# Patient Record
Sex: Male | Born: 1985 | Race: White | Hispanic: No | Marital: Married | Smoking: Current every day smoker
Health system: Southern US, Community
[De-identification: ages and names within clinical notes are randomized; demographics above are authoritative.]

## PROBLEM LIST (undated history)

## (undated) DIAGNOSIS — F419 Anxiety disorder, unspecified: Secondary | ICD-10-CM

## (undated) DIAGNOSIS — J439 Emphysema, unspecified: Secondary | ICD-10-CM

## (undated) DIAGNOSIS — F909 Attention-deficit hyperactivity disorder, unspecified type: Secondary | ICD-10-CM

## (undated) HISTORY — DX: Anxiety disorder, unspecified: F41.9

## (undated) HISTORY — DX: Attention-deficit hyperactivity disorder, unspecified type: F90.9

## (undated) HISTORY — PX: KNEE SURGERY: SHX244

---
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
## 2006-10-01 ENCOUNTER — Emergency Department: Payer: Self-pay | Admitting: Emergency Medicine

## 2006-12-12 ENCOUNTER — Emergency Department: Payer: Self-pay | Admitting: Emergency Medicine

## 2006-12-20 ENCOUNTER — Emergency Department: Payer: Self-pay | Admitting: Emergency Medicine

## 2007-10-20 ENCOUNTER — Emergency Department: Payer: Self-pay | Admitting: Emergency Medicine

## 2007-10-20 ENCOUNTER — Other Ambulatory Visit: Payer: Self-pay

## 2007-12-07 ENCOUNTER — Emergency Department: Payer: Self-pay | Admitting: Emergency Medicine

## 2008-01-20 ENCOUNTER — Ambulatory Visit: Payer: Self-pay | Admitting: Urology

## 2008-03-01 ENCOUNTER — Emergency Department: Payer: Self-pay | Admitting: Emergency Medicine

## 2008-03-07 ENCOUNTER — Emergency Department: Payer: Self-pay | Admitting: Emergency Medicine

## 2008-08-20 ENCOUNTER — Emergency Department: Payer: Self-pay | Admitting: Unknown Physician Specialty

## 2008-10-25 ENCOUNTER — Emergency Department: Payer: Self-pay | Admitting: Emergency Medicine

## 2010-03-18 ENCOUNTER — Emergency Department: Payer: Self-pay | Admitting: Emergency Medicine

## 2010-10-15 ENCOUNTER — Emergency Department: Payer: Self-pay | Admitting: Emergency Medicine

## 2011-02-02 ENCOUNTER — Ambulatory Visit: Payer: Self-pay

## 2011-02-04 ENCOUNTER — Emergency Department: Payer: Self-pay | Admitting: Emergency Medicine

## 2012-09-08 ENCOUNTER — Emergency Department: Payer: Self-pay | Admitting: Emergency Medicine

## 2017-10-25 ENCOUNTER — Encounter: Payer: Self-pay | Admitting: Emergency Medicine

## 2017-10-25 ENCOUNTER — Other Ambulatory Visit: Payer: Self-pay

## 2017-10-25 ENCOUNTER — Emergency Department
Admission: EM | Admit: 2017-10-25 | Discharge: 2017-10-25 | Disposition: A | Discharge status: Home / Self Care | Payer: Self-pay | Attending: Emergency Medicine | Admitting: Emergency Medicine

## 2017-10-25 ENCOUNTER — Emergency Department: Payer: Self-pay

## 2017-10-25 DIAGNOSIS — R251 Tremor, unspecified: Secondary | ICD-10-CM

## 2017-10-25 DIAGNOSIS — F17200 Nicotine dependence, unspecified, uncomplicated: Secondary | ICD-10-CM | POA: Insufficient documentation

## 2017-10-25 NOTE — ED Triage Notes (Addendum)
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

## 2017-10-27 NOTE — ED Provider Notes (Signed)
[facility] Emergency Department Provider Note    First MD Initiated Contact with Patient 10/25/17 0254     (approximate)  I have reviewed the triage vital signs and the nursing notes.   HISTORY  Chief Complaint Headache and Tremors   HPI Riley Hines is a 32 y.o. male presents to the emergency department with history of tremors.  Patient states that he has had intermittent tremors for the past year.  Patient states that multiple family members have "the shakes".  Patient denies any known family history of brain aneurysm or strokes.  Patient states that he had a headache today where he heard a "pop" area patient states that headache resolved following taking BC powder.  Patient denies any weakness numbness gait instability or visual changes.  Patient states that his tremors have resolved at this time.  Patient states the tremors occur both at rest and with activity.  Past medical history None  There are no active problems to display for this patient.   Past Surgical History:  Procedure Laterality Date  . KNEE SURGERY Left     Prior to Admission medications   Not on File    Allergies No known drug allergies No family history on file.  Social History Social History   Tobacco Use  . Smoking status: Current Every Day Smoker  . Smokeless tobacco: Never Used  Substance Use Topics  . Alcohol use: Never    Frequency: Never  . Drug use: Never    Review of Systems Constitutional: No fever/chills Eyes: No visual changes. ENT: No sore throat. Cardiovascular: Denies chest pain. Respiratory: Denies shortness of breath. Gastrointestinal: No abdominal pain.  No nausea, no vomiting.  No diarrhea.  No constipation. Genitourinary: Negative for dysuria. Musculoskeletal: Negative for neck pain.  Negative for back pain. Integumentary: Negative for rash. Neurological: Negative for headaches, focal weakness or numbness.  Positive for tremors (now  resolved)  ____________________________________________   PHYSICAL EXAM:  VITAL SIGNS: ED Triage Vitals  Enc Vitals Group     BP 10/25/17 0133 114/78     Pulse Rate 10/25/17 0133 65     Resp 10/25/17 0133 18     Temp 10/25/17 0133 (!) 97.5 F (36.4 C)     Temp Source 10/25/17 0133 Oral     SpO2 10/25/17 0133 98 %     Weight 10/25/17 0133 68 kg (150 lb)     Height 10/25/17 0133 1.651 m (5\' 5" )     Head Circumference --      Peak Flow --      Pain Score 10/25/17 0132 4     Pain Loc --      Pain Edu? --      Excl. in GC? --     Constitutional: Alert and oriented. Well appearing and in no acute distress. Eyes: Conjunctivae are normal. PERRL. EOMI. Head: Atraumatic. Mouth/Throat: Mucous membranes are moist.  Neck: No stridor.  No meningeal signs.  Cardiovascular: Normal rate, regular rhythm. Good peripheral circulation. Grossly normal heart sounds. Respiratory: Normal respiratory effort.  No retractions. Lungs CTAB. Gastrointestinal: Soft and nontender. No distention.  Musculoskeletal: No lower extremity tenderness nor edema. No gross deformities of extremities. Neurologic:  Normal speech and language. No gross focal neurologic deficits are appreciated.  No tremors appreciated Skin:  Skin is warm, dry and intact. No rash noted. Psychiatric: Mood and affect are normal. Speech and behavior are normal.    RADIOLOGY I, [place] Aqeel Norgaard, personally viewed and evaluated  these images (plain radiographs) as part of my medical decision making, as well as reviewing the written report by the radiologist.**}  ED MD interpretation: Normal noncontrast CT scan of the brain per radiologist.  Official radiology report(s): No results found.    Procedures   ____________________________________________   INITIAL IMPRESSION / ASSESSMENT AND PLAN / ED COURSE  As part of my medical decision making, I reviewed the following data within the electronic MEDICAL RECORD NUMBER   32 year old  male presenting to the emergency department with a history of chronic tremors.  Consider the possibility of intracranial pathology including mass CVA however suspected to be unlikely.  CT scan of the head revealed no acute abnormality.  Also consider possibly of essential tremor and as such patient will be referred to neurology for further outpatient evaluation. ____________________________________________  FINAL CLINICAL IMPRESSION(S) / ED DIAGNOSES  Final diagnoses:  Occasional tremors     MEDICATIONS GIVEN DURING THIS VISIT:  Medications - No data to display   ED Discharge Orders    None       Note:  This document was prepared using Dragon voice recognition software and may include unintentional dictation errors.    Darci Current, MD 10/27/17 2253

## 2018-05-01 ENCOUNTER — Other Ambulatory Visit: Payer: Self-pay | Admitting: Family Medicine

## 2018-05-06 ENCOUNTER — Other Ambulatory Visit: Payer: Self-pay | Admitting: Family Medicine

## 2018-05-13 ENCOUNTER — Emergency Department: Payer: No Typology Code available for payment source

## 2018-05-13 ENCOUNTER — Other Ambulatory Visit: Payer: Self-pay

## 2018-05-13 ENCOUNTER — Emergency Department
Admission: EM | Admit: 2018-05-13 | Discharge: 2018-05-13 | Disposition: A | Discharge status: Home / Self Care | Payer: No Typology Code available for payment source | Attending: Emergency Medicine | Admitting: Emergency Medicine

## 2018-05-13 DIAGNOSIS — Y9289 Other specified places as the place of occurrence of the external cause: Secondary | ICD-10-CM | POA: Insufficient documentation

## 2018-05-13 DIAGNOSIS — W270XXA Contact with workbench tool, initial encounter: Secondary | ICD-10-CM | POA: Insufficient documentation

## 2018-05-13 DIAGNOSIS — S6982XA Other specified injuries of left wrist, hand and finger(s), initial encounter: Secondary | ICD-10-CM | POA: Diagnosis present

## 2018-05-13 DIAGNOSIS — Y939 Activity, unspecified: Secondary | ICD-10-CM | POA: Insufficient documentation

## 2018-05-13 DIAGNOSIS — F17200 Nicotine dependence, unspecified, uncomplicated: Secondary | ICD-10-CM | POA: Diagnosis not present

## 2018-05-13 DIAGNOSIS — Y998 Other external cause status: Secondary | ICD-10-CM | POA: Insufficient documentation

## 2018-05-13 DIAGNOSIS — S60222A Contusion of left hand, initial encounter: Secondary | ICD-10-CM

## 2018-05-13 NOTE — ED Notes (Signed)
Pt employed with [employer], no workers comp profile listed in our system

## 2018-05-13 NOTE — Discharge Instructions (Signed)
Apply ice to reduce swelling. Take OTC Tylenol and Motrin for pain and inflammation/ Follow-up with [facility] as needed.

## 2018-05-13 NOTE — ED Triage Notes (Signed)
Pt was at work and sledgehammer fell onto left hand. Swelling noted to area.

## 2018-05-13 NOTE — ED Provider Notes (Signed)
[facility] Emergency Department Provider Note ____________________________________________  Time seen: 2320  I have reviewed the triage vital signs and the nursing notes.  HISTORY  Chief Complaint  Hand Pain  HPI Riley Hines is a 33 y.o. male since himself to the ED for evaluation of left hand pain.  Patient describes at work, he accidentally hit his left hand with a sledgehammer. He was trying to hit a metal punch he was holding.  He completed his workday and went home. His wife encouraged him to report to the ED due to swelling to the dorsal hand. He presents now with swelling noted to the left hand.  He denies any other injury at this time.  No past medical history on file.  There are no active problems to display for this patient.  Past Surgical History:  Procedure Laterality Date  . KNEE SURGERY Left     Prior to Admission medications   Not on File    Allergies Patient has no known allergies.  No family history on file.  Social History Social History   Tobacco Use  . Smoking status: Current Every Day Smoker  . Smokeless tobacco: Never Used  Substance Use Topics  . Alcohol use: Never    Frequency: Never  . Drug use: Never    Review of Systems  Constitutional: Negative for fever. Cardiovascular: Negative for chest pain. Respiratory: Negative for shortness of breath. Musculoskeletal: Negative for back pain.  Left hand pain as above. Skin: Negative for rash. Neurological: Negative for headaches, focal weakness or numbness. ____________________________________________  PHYSICAL EXAM:  VITAL SIGNS: ED Triage Vitals [05/13/18 2248]  Enc Vitals Group     BP (!) 131/92     Pulse Rate (!) 55     Resp 18     Temp 97.8 F (36.6 C)     Temp Source Oral     SpO2 99 %     Weight 150 lb (68 kg)     Height 5\' 5"  (1.651 m)     Head Circumference      Peak Flow      Pain Score 3     Pain Loc      Pain Edu?      Excl. in GC?      Constitutional: Alert and oriented. Well appearing and in no distress. Head: Normocephalic and atraumatic. Eyes: Conjunctivae are normal. Normal extraocular movements Cardiovascular: Normal rate, regular rhythm. Normal distal pulses. Respiratory: Normal respiratory effort. No wheezes/rales/rhonchi. Gastrointestinal: Soft and nontender. No distention. Musculoskeletal: Left hand with a moderate soft tissue swelling to the dorsolateral hand. Normal composite fist. Nontender with normal range of motion in all extremities.  Neurologic:  Normal gross sensation. Normal intrinsic and opposition testing. Normal speech and language. No gross focal neurologic deficits are appreciated. Skin:  Skin is warm, dry and intact. No rash noted. ____________________________________________   RADIOLOGY  Left Hand  Negative ____________________________________________  PROCEDURES  Procedures ____________________________________________  INITIAL IMPRESSION / ASSESSMENT AND PLAN / ED COURSE  Patient with ED evaluation of right hand contusion. He is reassured by his negative hand XR. He will follow-up with [facility] as needed. He will apply ice for swelling. He is returned to work without restrictions.  ____________________________________________  FINAL CLINICAL IMPRESSION(S) / ED DIAGNOSES  Final diagnoses:  Contusion of left hand, initial encounter      Lissa Hoard, PA-C 05/14/18 0011    Phineas Semen, MD 05/16/18 1155

## 2018-05-15 ENCOUNTER — Other Ambulatory Visit: Payer: Self-pay | Admitting: Family Medicine

## 2018-05-15 DIAGNOSIS — R221 Localized swelling, mass and lump, neck: Secondary | ICD-10-CM

## 2018-05-15 DIAGNOSIS — R9389 Abnormal findings on diagnostic imaging of other specified body structures: Secondary | ICD-10-CM

## 2018-05-16 ENCOUNTER — Ambulatory Visit: Admission: EM | Admit: 2018-05-16 | Discharge: 2018-05-16 | Discharge status: Against Medical Advice | Payer: Self-pay

## 2018-05-22 ENCOUNTER — Ambulatory Visit
Admission: RE | Admit: 2018-05-22 | Discharge: 2018-05-22 | Disposition: A | Discharge status: Home / Self Care | Payer: Commercial Managed Care - PPO | Source: Ambulatory Visit | Attending: Family Medicine | Admitting: Family Medicine

## 2018-05-22 DIAGNOSIS — R9389 Abnormal findings on diagnostic imaging of other specified body structures: Secondary | ICD-10-CM | POA: Diagnosis not present

## 2018-05-22 DIAGNOSIS — R221 Localized swelling, mass and lump, neck: Secondary | ICD-10-CM | POA: Diagnosis not present

## 2018-05-22 MED ORDER — IOPAMIDOL (ISOVUE-300) INJECTION 61%
75.0000 mL | Freq: Once | INTRAVENOUS | Status: AC | PRN
  Administered 2018-05-22: 75 mL via INTRAVENOUS

## 2018-06-12 DIAGNOSIS — F411 Generalized anxiety disorder: Secondary | ICD-10-CM | POA: Insufficient documentation

## 2018-06-13 ENCOUNTER — Emergency Department
Admission: EM | Admit: 2018-06-13 | Discharge: 2018-06-13 | Disposition: A | Discharge status: Home / Self Care | Payer: Commercial Managed Care - PPO | Attending: Emergency Medicine | Admitting: Emergency Medicine

## 2018-06-13 ENCOUNTER — Other Ambulatory Visit: Payer: Self-pay

## 2018-06-13 ENCOUNTER — Encounter: Payer: Self-pay | Admitting: Emergency Medicine

## 2018-06-13 ENCOUNTER — Emergency Department: Payer: Commercial Managed Care - PPO

## 2018-06-13 DIAGNOSIS — J101 Influenza due to other identified influenza virus with other respiratory manifestations: Secondary | ICD-10-CM | POA: Insufficient documentation

## 2018-06-13 DIAGNOSIS — J189 Pneumonia, unspecified organism: Secondary | ICD-10-CM | POA: Diagnosis not present

## 2018-06-13 DIAGNOSIS — J181 Lobar pneumonia, unspecified organism: Secondary | ICD-10-CM | POA: Diagnosis not present

## 2018-06-13 DIAGNOSIS — R05 Cough: Secondary | ICD-10-CM | POA: Diagnosis present

## 2018-06-13 HISTORY — DX: Emphysema, unspecified: J43.9

## 2018-06-13 LAB — INFLUENZA PANEL BY PCR (TYPE A & B)
Influenza A By PCR: POSITIVE — AB
Influenza B By PCR: NEGATIVE

## 2018-06-13 MED ORDER — ACETAMINOPHEN 325 MG PO TABS
ORAL_TABLET | ORAL | Status: AC
  Administered 2018-06-13: 650 mg via ORAL
  Filled 2018-06-13: qty 2

## 2018-06-13 MED ORDER — IBUPROFEN 600 MG PO TABS: 600.0000 mg | ORAL_TABLET | Freq: Three times a day (TID) | ORAL | 0 refills | Status: DC | PRN

## 2018-06-13 MED ORDER — PROMETHAZINE-DM 6.25-15 MG/5ML PO SYRP: 5.0000 mL | ORAL_SOLUTION | Freq: Four times a day (QID) | ORAL | 0 refills | Status: DC | PRN

## 2018-06-13 MED ORDER — OSELTAMIVIR PHOSPHATE 75 MG PO CAPS: 75.0000 mg | ORAL_CAPSULE | Freq: Two times a day (BID) | ORAL | 0 refills | Status: AC

## 2018-06-13 MED ORDER — ACETAMINOPHEN 325 MG PO TABS
650.0000 mg | ORAL_TABLET | Freq: Once | ORAL | Status: AC | PRN
  Administered 2018-06-13: 650 mg via ORAL

## 2018-06-13 MED ORDER — HYDROCOD POLST-CPM POLST ER 10-8 MG/5ML PO SUER
5.0000 mL | Freq: Once | ORAL | Status: AC
  Administered 2018-06-13: 5 mL via ORAL
  Filled 2018-06-13: qty 5

## 2018-06-13 MED ORDER — AZITHROMYCIN 250 MG PO TABS: ORAL_TABLET | ORAL | 0 refills | Status: AC

## 2018-06-13 NOTE — ED Provider Notes (Signed)
[facility] Emergency Department Provider Note   ____________________________________________   First MD Initiated Contact with Patient 06/13/18 1335     (approximate)  I have reviewed the triage vital signs and the nursing notes.   HISTORY  Chief Complaint Fever and Cough    HPI Riley Hines is a 33 y.o. male patient presents with cute onset of cough and fever which began last night.  Patient waking this morning with increased cough and body aches.  Patient unable to smoke secondary to coughing.  Patient denies nausea, vomiting, diarrhea.  Patient states he never takes flu shots.  Patient rates his pain as a 5/10.  Patient described the pain is "achy".  No palliative measure for complaint.  Patient presents with fever in triage and was given Tylenol.         Past Medical History:  Diagnosis Date  . Lung blebs (HCC)     There are no active problems to display for this patient.   Past Surgical History:  Procedure Laterality Date  . KNEE SURGERY Left     Prior to Admission medications   Medication Sig Start Date End Date Taking? Authorizing Provider  azithromycin (ZITHROMAX Z-PAK) 250 MG tablet Take 2 tablets (500 mg) on  Day 1,  followed by 1 tablet (250 mg) once daily on Days 2 through 5. 06/13/18 06/18/18  Joni Reining, PA-C  ibuprofen (ADVIL,MOTRIN) 600 MG tablet Take 1 tablet (600 mg total) by mouth every 8 (eight) hours as needed. 06/13/18   Joni Reining, PA-C  oseltamivir (TAMIFLU) 75 MG capsule Take 1 capsule (75 mg total) by mouth 2 (two) times daily for 5 days. 06/13/18 06/18/18  Joni Reining, PA-C  promethazine-dextromethorphan (PROMETHAZINE-DM) 6.25-15 MG/5ML syrup Take 5 mLs by mouth 4 (four) times daily as needed for cough. 06/13/18   Joni Reining, PA-C    Allergies Hydrocodone; Oxycodone; and Oxycodone-acetaminophen  History reviewed. No pertinent family history.  Social History Social History   Tobacco Use  . Smoking  status: Current Every Day Smoker  . Smokeless tobacco: Never Used  Substance Use Topics  . Alcohol use: Never    Frequency: Never  . Drug use: Never    Review of Systems Constitutional: Fever and body aches. Eyes: No visual changes. ENT: No sore throat.  Nasal congestion. Cardiovascular: Denies chest pain. Respiratory: Denies shortness of breath.  Productive cough. Gastrointestinal: No abdominal pain.  No nausea, no vomiting.  No diarrhea.  No constipation. Genitourinary: Negative for dysuria. Musculoskeletal: Negative for back pain. Skin: Negative for rash. Neurological: Negative for headaches, focal weakness or numbness. Allergic/Immunilogical: Hydrocodone.  ____________________________________________   PHYSICAL EXAM:  VITAL SIGNS: ED Triage Vitals [06/13/18 1324]  Enc Vitals Group     BP      Pulse Rate (!) 102     Resp (!) 24     Temp (!) 102 F (38.9 C)     Temp Source Oral     SpO2 97 %     Weight 150 lb (68 kg)     Height 5\' 5"  (1.651 m)     Head Circumference      Peak Flow      Pain Score 5     Pain Loc      Pain Edu?      Excl. in GC?     Constitutional: Alert and oriented.  Moderate distress.  Febrile.   Eyes: Conjunctivae are normal. PERRL. EOMI. Head: Atraumatic. Nose: Edematous nasal  turbinates clear rhinorrhea.  Mouth/Throat: Mucous membranes are moist.  Oropharynx non-erythematous.  Postnasal drainage. Neck: No stridor.  Hematological/Lymphatic/Immunilogical: No cervical lymphadenopathy. Cardiovascular: Normal rate, regular rhythm. Grossly normal heart sounds.  Good peripheral circulation. Respiratory: Normal respiratory effort.  No retractions. Lungs CTAB. Gastrointestinal: Soft and nontender. No distention. No abdominal bruits. No CVA tenderness. Musculoskeletal: No lower extremity tenderness nor edema.  No joint effusions. Neurologic:  Normal speech and language. No gross focal neurologic deficits are appreciated. No gait instability. Skin:   Skin is warm, dry and intact. No rash noted. Psychiatric: Mood and affect are normal. Speech and behavior are normal.  ____________________________________________   LABS (all labs ordered are listed, but only abnormal results are displayed)  Labs Reviewed  INFLUENZA PANEL BY PCR (TYPE A & B) - Abnormal; Notable for the following components:      Result Value   Influenza A By PCR POSITIVE (*)    All other components within normal limits   ____________________________________________  EKG   ____________________________________________  RADIOLOGY  ED MD interpretation:    Official radiology report(s): Dg Chest 2 View  Result Date: 06/13/2018 CLINICAL DATA:  Shortness of breath, fever and productive cough. EXAM: CHEST - 2 VIEW COMPARISON:  08/20/2008 FINDINGS: Cardiomediastinal silhouette is normal. Mediastinal contours appear intact. There is no evidence of pleural effusion or pneumothorax. Subtle peribronchial airspace consolidation versus pulmonary nodules in the right lung base. Upper lobe predominant emphysema/subpleural blebs noted. Osseous structures are without acute abnormality. Soft tissues are grossly normal. IMPRESSION: 1. Subtle peribronchial airspace consolidation versus pulmonary nodules in the right lung base. If there is clinical suspicion for pneumonia, follow up to resolution after empiric treatment is recommended. 2. Upper lobe predominant emphysema/subpleural blebs. Electronically Signed   By: Ted Mcalpine M.D.   On: 06/13/2018 14:04    ____________________________________________   PROCEDURES  Procedure(s) performed (including Critical Care):  Procedures   ____________________________________________   INITIAL IMPRESSION / ASSESSMENT AND PLAN / ED COURSE  As part of my medical decision making, I reviewed the following data within the electronic MEDICAL RECORD NUMBER     Patient presents with  onset of fever and cough which began last night.    Patient x-ray is consistent with findings of pneumonia.  Patient also test positive for influenza A.  Patient given discharge care instruction work note.  Patient advised take medication as directed and [facility]          ____________________________________________   FINAL CLINICAL IMPRESSION(S) / ED DIAGNOSES  Final diagnoses:  Influenza A  Community acquired pneumonia of right lower lobe of lung [facility])     ED Discharge Orders         Ordered    oseltamivir (TAMIFLU) 75 MG capsule  2 times daily     06/13/18 1455    azithromycin (ZITHROMAX Z-PAK) 250 MG tablet     06/13/18 1455    promethazine-dextromethorphan (PROMETHAZINE-DM) 6.25-15 MG/5ML syrup  4 times daily PRN     06/13/18 1455    ibuprofen (ADVIL,MOTRIN) 600 MG tablet  Every 8 hours PRN     06/13/18 1455           Note:  This document was prepared using Dragon voice recognition software and may include unintentional dictation errors.    Joni Reining, PA-C 06/13/18 1458    Sharman Cheek, MD 06/17/18 [phone]

## 2018-06-13 NOTE — ED Triage Notes (Signed)
Pt presents to ED via POV with c/o cough and fever that started last night. Pt c/o productive cough, states this morning while attempting to smoke a cigarette he began to gag. Pt also c/o generalized body aches, states symptoms started last night. Pt with fever and productive cough upon arrival to ED. Mask given prior to patient arrival in triage.

## 2018-07-19 ENCOUNTER — Other Ambulatory Visit: Payer: Self-pay

## 2018-07-19 ENCOUNTER — Ambulatory Visit: Payer: Commercial Managed Care - PPO | Admitting: Licensed Clinical Social Worker

## 2018-07-31 ENCOUNTER — Ambulatory Visit (INDEPENDENT_AMBULATORY_CARE_PROVIDER_SITE_OTHER): Payer: Commercial Managed Care - PPO | Admitting: Psychiatry

## 2018-07-31 ENCOUNTER — Other Ambulatory Visit: Payer: Self-pay

## 2018-07-31 DIAGNOSIS — Z5329 Procedure and treatment not carried out because of patient's decision for other reasons: Secondary | ICD-10-CM

## 2018-07-31 NOTE — Progress Notes (Deleted)
Psychiatric Initial Adult Assessment   Patient Identification: Riley Hines MRN:  [id number] Date of Evaluation:  07/31/2018 Referral Source: *** Chief Complaint:   Visit Diagnosis: No diagnosis found.  History of Present Illness:  ***  Associated Signs/Symptoms: Depression Symptoms:  {DEPRESSION SYMPTOMS:20000} (Hypo) Manic Symptoms:  {BHH MANIC SYMPTOMS:22872} Anxiety Symptoms:  {BHH ANXIETY SYMPTOMS:22873} Psychotic Symptoms:  {BHH PSYCHOTIC SYMPTOMS:22874} PTSD Symptoms: {BHH PTSD SYMPTOMS:22875}  Past Psychiatric History: ***  Previous Psychotropic Medications: {YES/NO:21197}  Substance Abuse History in the last 12 months:  {yes no:314532}  Consequences of Substance Abuse: {BHH CONSEQUENCES OF SUBSTANCE ABUSE:22880}  Past Medical History:  Past Medical History:  Diagnosis Date  . Lung blebs [facility])     Past Surgical History:  Procedure Laterality Date  . KNEE SURGERY Left     Family Psychiatric History: ***  Family History: No family history on file.  Social History:   Social History   Socioeconomic History  . Marital status: Married    Spouse name: Not on file  . Number of children: Not on file  . Years of education: Not on file  . Highest education level: Not on file  Occupational History  . Not on file  Social Needs  . Financial resource strain: Not on file  . Food insecurity:    Worry: Not on file    Inability: Not on file  . Transportation needs:    Medical: Not on file    Non-medical: Not on file  Tobacco Use  . Smoking status: Current Every Day Smoker  . Smokeless tobacco: Never Used  Substance and Sexual Activity  . Alcohol use: Never    Frequency: Never  . Drug use: Never  . Sexual activity: Not on file  Lifestyle  . Physical activity:    Days per week: Not on file    Minutes per session: Not on file  . Stress: Not on file  Relationships  . Social connections:    Talks on phone: Not on file    Gets together: Not on file   Attends religious service: Not on file    Active member of club or organization: Not on file    Attends meetings of clubs or organizations: Not on file    Relationship status: Not on file  Other Topics Concern  . Not on file  Social History Narrative  . Not on file    Additional Social History: ***  Allergies:   Allergies  Allergen Reactions  . Hydrocodone Other (See Comments)    Violent, suicidal thoughts.    . Oxycodone Other (See Comments)    Pt rerpots feeling bugs under the skin, cannot sleep, hallucinations   . Oxycodone-Acetaminophen Other (See Comments)    Violent, suicidal thoughts.     Metabolic Disorder Labs: No results found for: HGBA1C, MPG No results found for: PROLACTIN No results found for: CHOL, TRIG, HDL, CHOLHDL, VLDL, LDLCALC No results found for: TSH  Therapeutic Level Labs: No results found for: LITHIUM No results found for: CBMZ No results found for: VALPROATE  Current Medications: Current Outpatient Medications  Medication Sig Dispense Refill  . ibuprofen (ADVIL,MOTRIN) 600 MG tablet Take 1 tablet (600 mg total) by mouth every 8 (eight) hours as needed. 15 tablet 0  . promethazine-dextromethorphan (PROMETHAZINE-DM) 6.25-15 MG/5ML syrup Take 5 mLs by mouth 4 (four) times daily as needed for cough. 118 mL 0   No current facility-administered medications for this visit.     Musculoskeletal: Strength & Muscle Tone: {desc; muscle tone:32375} Gait &  Station: {PE GAIT ED [id number]:22525} Patient leans: {Patient Leans:21022755}  Psychiatric Specialty Exam: ROS  There were no vitals taken for this visit.There is no height or weight on file to calculate BMI.  General Appearance: {Appearance:22683}  Eye Contact:  {BHH EYE CONTACT:22684}  Speech:  {Speech:22685}  Volume:  {Volume (PAA):22686}  Mood:  {BHH MOOD:22306}  Affect:  {Affect (PAA):22687}  Thought Process:  {Thought Process (PAA):22688}  Orientation:  {BHH ORIENTATION (PAA):22689}  Thought  Content:  {Thought Content:22690}  Suicidal Thoughts:  {ST/HT (PAA):22692}  Homicidal Thoughts:  {ST/HT (PAA):22692}  Memory:  {BHH MEMORY:22881}  Judgement:  {Judgement (PAA):22694}  Insight:  {Insight (PAA):22695}  Psychomotor Activity:  {Psychomotor (PAA):22696}  Concentration:  {Concentration:21399}  Recall:  {BHH GOOD/FAIR/POOR:22877}  Fund of Knowledge:{BHH GOOD/FAIR/POOR:22877}  Language: {BHH GOOD/FAIR/POOR:22877}  Akathisia:  {BHH YES OR NO:22294}  Handed:  {Handed:22697}  AIMS (if indicated):  {Desc; done/not:10129}  Assets:  {Assets (PAA):22698}  ADL's:  {BHH ZOX'W:96045}ADL'S:22290}  Cognition: {chl bhh cognition:304700322}  Sleep:  {BHH GOOD/FAIR/POOR:22877}   Screenings:   Assessment and Plan: ***   [name], MD 4/29/20209:57 AM

## 2018-08-08 ENCOUNTER — Encounter: Payer: Self-pay | Admitting: Psychiatry

## 2018-08-08 ENCOUNTER — Other Ambulatory Visit: Payer: Self-pay

## 2018-08-08 ENCOUNTER — Ambulatory Visit (INDEPENDENT_AMBULATORY_CARE_PROVIDER_SITE_OTHER): Payer: Commercial Managed Care - PPO | Admitting: Psychiatry

## 2018-08-08 DIAGNOSIS — F17200 Nicotine dependence, unspecified, uncomplicated: Secondary | ICD-10-CM

## 2018-08-08 DIAGNOSIS — F411 Generalized anxiety disorder: Secondary | ICD-10-CM | POA: Diagnosis not present

## 2018-08-08 DIAGNOSIS — F3181 Bipolar II disorder: Secondary | ICD-10-CM

## 2018-08-08 MED ORDER — QUETIAPINE FUMARATE 25 MG PO TABS: 25.0000 mg | ORAL_TABLET | Freq: Every day | ORAL | 0 refills | Status: DC
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
## 2018-08-08 NOTE — Progress Notes (Signed)
Tc on  08-08-18 @ 1:05 pt medical and surgical hx was reviewed and updated. Pt medications and pharmacy was reviewed and updated. Allergies were reviewed with no changes. No vitals taken due to this was a phone review.

## 2018-08-20 ENCOUNTER — Encounter: Payer: Self-pay | Admitting: Psychiatry

## 2018-08-20 ENCOUNTER — Other Ambulatory Visit: Payer: Self-pay

## 2018-08-20 ENCOUNTER — Ambulatory Visit (INDEPENDENT_AMBULATORY_CARE_PROVIDER_SITE_OTHER): Payer: Commercial Managed Care - PPO | Admitting: Psychiatry

## 2018-08-20 DIAGNOSIS — F17200 Nicotine dependence, unspecified, uncomplicated: Secondary | ICD-10-CM

## 2018-08-20 DIAGNOSIS — F3181 Bipolar II disorder: Secondary | ICD-10-CM | POA: Diagnosis not present

## 2018-08-20 DIAGNOSIS — F411 Generalized anxiety disorder: Secondary | ICD-10-CM | POA: Diagnosis not present

## 2018-08-20 MED ORDER — ARIPIPRAZOLE 2 MG PO TABS: 2.0000 mg | ORAL_TABLET | Freq: Every day | ORAL | 1 refills | Status: DC

## 2018-08-20 MED ORDER — TRAZODONE HCL 50 MG PO TABS: 25.0000 mg | ORAL_TABLET | Freq: Every evening | ORAL | 1 refills | Status: DC | PRN
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
## 2018-08-30 ENCOUNTER — Other Ambulatory Visit: Payer: Self-pay | Admitting: Psychiatry

## 2018-08-30 DIAGNOSIS — F3181 Bipolar II disorder: Secondary | ICD-10-CM

## 2018-09-11 ENCOUNTER — Other Ambulatory Visit: Payer: Self-pay

## 2018-09-11 ENCOUNTER — Encounter: Payer: Self-pay | Admitting: Psychiatry

## 2018-09-11 ENCOUNTER — Ambulatory Visit (INDEPENDENT_AMBULATORY_CARE_PROVIDER_SITE_OTHER): Payer: Commercial Managed Care - PPO | Admitting: Psychiatry

## 2018-09-11 DIAGNOSIS — F411 Generalized anxiety disorder: Secondary | ICD-10-CM

## 2018-09-11 DIAGNOSIS — F3181 Bipolar II disorder: Secondary | ICD-10-CM

## 2018-09-11 DIAGNOSIS — F17200 Nicotine dependence, unspecified, uncomplicated: Secondary | ICD-10-CM

## 2018-09-11 MED ORDER — ESCITALOPRAM OXALATE 10 MG PO TABS: 10.0000 mg | ORAL_TABLET | Freq: Every day | ORAL | 2 refills | Status: DC

## 2018-09-11 NOTE — Progress Notes (Signed)
Virtual Visit via Video Note  I connected with Riley Hines on 09/11/18 at 11:45 AM EDT by a video enabled telemedicine application and verified that I am speaking with the correct person using two identifiers.   I discussed the limitations of evaluation and management by telemedicine and the availability of in person appointments. The patient expressed understanding and agreed to proceed.    I discussed the assessment and treatment plan with the patient. The patient was provided an opportunity to ask questions and all were answered. The patient agreed with the plan and demonstrated an understanding of the instructions.   The patient was advised to call back or seek an in-person evaluation if the symptoms worsen or if the condition fails to improve as anticipated.   [facility] MD OP Progress Note  09/11/2018 1:28 PM Riley Hines  MRN:  [id number]  Chief Complaint:  Chief Complaint    Follow-up     HPI: Riley Hines is a 33 year old Caucasian male, married, employed, lives in [place] has a history of bipolar disorder, GAD, tobacco use disorder was evaluated by telemedicine today.  Patient today reports he is tolerating the Abilify well.  He reports the Abilify has improved his mood lability.  He reports his irritability and anxiety symptoms have improved a lot with the Abilify.  Patient reports he continues to be compliant with Lexapro.  He reports sleep is improved on the combination of medications.  He also has trazodone available.  He continues to smoke cigarettes and has been trying to cut back.  He reports he has been Spending more quality time with his wife.  This also has helped.  Patient reports he never got a call for the therapy appointment.  Discussed with him that the referral can be send again today.  Patient denies any other concerns today. Visit Diagnosis:    ICD-10-CM   1. Bipolar 2 disorder (HCC) F31.81 escitalopram (LEXAPRO) 10 MG tablet   mixed mild  2. GAD (generalized  anxiety disorder) F41.1 escitalopram (LEXAPRO) 10 MG tablet  3. Tobacco use disorder F17.200     Past Psychiatric History: Reviewed past psychiatric history from my progress note on 08/08/2018.  Past trials of Lexapro.  Past Medical History:  Past Medical History:  Diagnosis Date  . ADHD (attention deficit hyperactivity disorder)   . Anxiety   . Lung blebs [facility](HCC)     Past Surgical History:  Procedure Laterality Date  . KNEE SURGERY Left     Family Psychiatric History: Reviewed family psychiatric history from my progress note on 08/08/2018.  Family History:  Family History  Problem Relation Age of Onset  . Anxiety disorder Father   . Depression Father   . ADD / ADHD Brother     Social History: I have reviewed social history from my progress note on 08/08/2018. Social History   Socioeconomic History  . Marital status: Married    Spouse name: amy  . Number of children: 3  . Years of education: Not on file  . Highest education level: High school graduate  Occupational History  . Not on file  Social Needs  . Financial resource strain: Not hard at all  . Food insecurity:    Worry: Never true    Inability: Never true  . Transportation needs:    Medical: No    Non-medical: No  Tobacco Use  . Smoking status: Current Every Day Smoker    Packs/day: 1.00    Types: Cigarettes  . Smokeless tobacco: Never Used  Substance  and Sexual Activity  . Alcohol use: Not Currently    Frequency: Never  . Drug use: Never  . Sexual activity: Yes  Lifestyle  . Physical activity:    Days per week: 0 days    Minutes per session: 0 min  . Stress: Rather much  Relationships  . Social connections:    Talks on phone: Not on file    Gets together: Not on file    Attends religious service: Never    Active member of club or organization: No    Attends meetings of clubs or organizations: Never    Relationship status: Married  Other Topics Concern  . Not on file  Social History Narrative  .  Not on file    Allergies:  Allergies  Allergen Reactions  . Hydrocodone Other (See Comments)    Violent, suicidal thoughts.    . Oxycodone Other (See Comments)    Pt rerpots feeling bugs under the skin, cannot sleep, hallucinations   . Oxycodone-Acetaminophen Other (See Comments)    Violent, suicidal thoughts.     Metabolic Disorder Labs: No results found for: HGBA1C, MPG No results found for: PROLACTIN No results found for: CHOL, TRIG, HDL, CHOLHDL, VLDL, LDLCALC No results found for: TSH  Therapeutic Level Labs: No results found for: LITHIUM No results found for: VALPROATE No components found for:  CBMZ  Current Medications: Current Outpatient Medications  Medication Sig Dispense Refill  . ARIPiprazole (ABILIFY) 2 MG tablet Take 1 tablet (2 mg total) by mouth daily. For mood 30 tablet 1  . escitalopram (LEXAPRO) 10 MG tablet Take 1 tablet (10 mg total) by mouth daily. 30 tablet 2  . traZODone (DESYREL) 50 MG tablet Take 0.5-1 tablets (25-50 mg total) by mouth at bedtime as needed for sleep. 30 tablet 1   No current facility-administered medications for this visit.      Musculoskeletal: Strength & Muscle Tone: within normal limits Gait & Station: normal Patient leans: N/A  Psychiatric Specialty Exam: Review of Systems  Psychiatric/Behavioral: The patient is nervous/anxious.   All other systems reviewed and are negative.   There were no vitals taken for this visit.There is no height or weight on file to calculate BMI.  General Appearance: Casual  Eye Contact:  Fair  Speech:  Clear and Coherent  Volume:  Normal  Mood:  Anxious  Affect:  Congruent  Thought Process:  Goal Directed and Descriptions of Associations: Intact  Orientation:  Full (Time, Place, and Person)  Thought Content: Logical   Suicidal Thoughts:  No  Homicidal Thoughts:  No  Memory:  Immediate;   Fair Recent;   Fair Remote;   Fair  Judgement:  Fair  Insight:  Fair  Psychomotor Activity:   Normal  Concentration:  Concentration: Fair and Attention Span: Fair  Recall:  [employer]  Fund of Knowledge: Fair  Language: Fair  Akathisia:  No  Handed:  Right  AIMS (if indicated): Denies tremors, rigidity, stiffness  Assets:  Communication Skills Desire for Improvement Social Support  ADL's:  Intact  Cognition: WNL  Sleep:  improving   Screenings:   Assessment and Plan: Riley Hines is a 33 year old Caucasian male, employed, married, lives in [place], has a history of anxiety, bipolar disorder, tobacco use disorder was evaluated by telemedicine today.  Patient is biologically predisposed given his family history as well as history of trauma.  He also has psychosocial stressors of current COVID-19 outbreak.  Patient however is making progress on the current medication regimen.  Discussed  plan as noted below.  Plan Bipolar disorder type II-improving Abilify 2 mg p.o. daily. Lexapro as prescribed Trazodone 25 to 50 mg p.o. nightly as needed for insomnia  For GAD-some improvement Lexapro 10 mg p.o. daily. Patient has been referred for CBT-pending  For tobacco use disorder- improving Provided smoking cessation counseling.  For history of trauma- we will refer him to our therapist here in clinic.-Pending.  Follow-up in clinic in 1 month or sooner if needed.  July 7 at 10:15 AM.  I have spent atleast 15 minutes non  face to face with patient today. More than 50 % of the time was spent for psychoeducation and supportive psychotherapy and care coordination.  This note was generated in part or whole with voice recognition software. Voice recognition is usually quite accurate but there are transcription errors that can and very often do occur. I apologize for any typographical errors that were not detected and corrected.       Ursula Alert, MD 09/11/2018, 1:28 PM

## 2018-10-01 ENCOUNTER — Ambulatory Visit: Payer: Commercial Managed Care - PPO | Admitting: Licensed Clinical Social Worker

## 2018-10-01 ENCOUNTER — Other Ambulatory Visit: Payer: Self-pay

## 2018-10-08 ENCOUNTER — Ambulatory Visit (INDEPENDENT_AMBULATORY_CARE_PROVIDER_SITE_OTHER): Payer: Commercial Managed Care - PPO | Admitting: Psychiatry

## 2018-10-08 ENCOUNTER — Other Ambulatory Visit: Payer: Self-pay | Admitting: Psychiatry

## 2018-10-08 ENCOUNTER — Other Ambulatory Visit: Payer: Self-pay

## 2018-10-08 ENCOUNTER — Encounter: Payer: Self-pay | Admitting: Psychiatry

## 2018-10-08 DIAGNOSIS — F3181 Bipolar II disorder: Secondary | ICD-10-CM

## 2018-10-08 DIAGNOSIS — F411 Generalized anxiety disorder: Secondary | ICD-10-CM | POA: Diagnosis not present

## 2018-10-08 DIAGNOSIS — F17200 Nicotine dependence, unspecified, uncomplicated: Secondary | ICD-10-CM | POA: Insufficient documentation

## 2018-10-08 MED ORDER — ARIPIPRAZOLE 10 MG PO TABS: 10.0000 mg | ORAL_TABLET | Freq: Every day | ORAL | 1 refills | Status: DC

## 2018-10-08 MED ORDER — HYDROXYZINE HCL 25 MG PO TABS: 25.0000 mg | ORAL_TABLET | Freq: Two times a day (BID) | ORAL | 1 refills | Status: DC | PRN

## 2018-10-08 MED ORDER — TRAZODONE HCL 100 MG PO TABS: 100.0000 mg | ORAL_TABLET | Freq: Every day | ORAL | 1 refills | Status: DC

## 2018-10-08 NOTE — Progress Notes (Signed)
Virtual Visit via Video Note  I connected with Riley Hines on 10/08/18 at 10:15 AM EDT by a video enabled telemedicine application and verified that I am speaking with the correct person using two identifiers.   I discussed the limitations of evaluation and management by telemedicine and the availability of in person appointments. The patient expressed understanding and agreed to proceed.   I discussed the assessment and treatment plan with the patient. The patient was provided an opportunity to ask questions and all were answered. The patient agreed with the plan and demonstrated an understanding of the instructions.   The patient was advised to call back or seek an in-person evaluation if the symptoms worsen or if the condition fails to improve as anticipated.   [place] MD OP Progress Note  10/08/2018 12:05 PM Riley Hines  MRN:  [id number]  Chief Complaint:  Chief Complaint    Follow-up     HPI: Riley Hines is a 33 year old Caucasian male, married, employed, lives in [place], has a history of bipolar disorder, GAD, tobacco use disorder was evaluated by telemedicine today.  Patient today reports he is struggling with some mood lability, irritability, arguments with his wife.  He reports the Abilify was helpful initially however since the past couple of weeks he has noticed his mood symptoms as worsening.  He also struggles with sleep problems again.  He did have sleep problems previously on the trazodone low dosage was helpful.  Patient however reports the trazodone at this dosage is not helpful anymore.  He reports he gets around 3 to 4 hours of sleep at night.  Patient reports he continues to be compliant with his Lexapro.  He however reports he just started taking the 10 mg and wonders whether that has anything to do with his irritability.  Discussed with patient that he had irritability and mood swings and sleep problems when he initially came to Probation officer.  So it is likely that the Abilify 2 mg  may not be enough and we could try readjusting his dosage today.  Also discussed adding hydroxyzine as needed for severe agitation as well as sleep.  Discussed with patient that if he notices continued mood symptoms and irritability, worsening symptoms, to reach out to Probation officer.  He agrees with plan.  Patient does have upcoming appointment with therapist, he is motivated to keep it. Visit Diagnosis:    ICD-10-CM   1. Bipolar 2 disorder (HCC)  F31.81 ARIPiprazole (ABILIFY) 10 MG tablet    traZODone (DESYREL) 100 MG tablet    hydrOXYzine (ATARAX/VISTARIL) 25 MG tablet   mixed, mild  2. GAD (generalized anxiety disorder)  F41.1 hydrOXYzine (ATARAX/VISTARIL) 25 MG tablet  3. Tobacco use disorder  F17.200     Past Psychiatric History: I have reviewed past psychiatric history from my progress note on 08/08/2018.  Past trials of Lexapro.  Past Medical History:  Past Medical History:  Diagnosis Date  . ADHD (attention deficit hyperactivity disorder)   . Anxiety   . Lung blebs [facility])     Past Surgical History:  Procedure Laterality Date  . KNEE SURGERY Left     Family Psychiatric History: I have reviewed family psychiatric history from my progress note on 08/08/2018. Family History:  Family History  Problem Relation Age of Onset  . Anxiety disorder Father   . Depression Father   . ADD / ADHD Brother     Social History: I have reviewed social history from my progress note on 08/08/2018. Social History   Socioeconomic  History  . Marital status: Married    Spouse name: amy  . Number of children: 3  . Years of education: Not on file  . Highest education level: High school graduate  Occupational History  . Not on file  Social Needs  . Financial resource strain: Not hard at all  . Food insecurity    Worry: Never true    Inability: Never true  . Transportation needs    Medical: No    Non-medical: No  Tobacco Use  . Smoking status: Current Every Day Smoker    Packs/day: 1.00    Types:  Cigarettes  . Smokeless tobacco: Never Used  Substance and Sexual Activity  . Alcohol use: Not Currently    Frequency: Never  . Drug use: Never  . Sexual activity: Yes  Lifestyle  . Physical activity    Days per week: 0 days    Minutes per session: 0 min  . Stress: Rather much  Relationships  . Social Musicianconnections    Talks on phone: Not on file    Gets together: Not on file    Attends religious service: Never    Active member of club or organization: No    Attends meetings of clubs or organizations: Never    Relationship status: Married  Other Topics Concern  . Not on file  Social History Narrative  . Not on file    Allergies:  Allergies  Allergen Reactions  . Hydrocodone Other (See Comments)    Violent, suicidal thoughts.    . Oxycodone Other (See Comments)    Pt rerpots feeling bugs under the skin, cannot sleep, hallucinations   . Oxycodone-Acetaminophen Other (See Comments)    Violent, suicidal thoughts.     Metabolic Disorder Labs: No results found for: HGBA1C, MPG No results found for: PROLACTIN No results found for: CHOL, TRIG, HDL, CHOLHDL, VLDL, LDLCALC No results found for: TSH  Therapeutic Level Labs: No results found for: LITHIUM No results found for: VALPROATE No components found for:  CBMZ  Current Medications: Current Outpatient Medications  Medication Sig Dispense Refill  . ARIPiprazole (ABILIFY) 10 MG tablet Take 1 tablet (10 mg total) by mouth daily. Start taking 5 mg for 2 weeks and increase to 10 mg ( 1 tablet) 30 tablet 1  . escitalopram (LEXAPRO) 10 MG tablet Take 1 tablet (10 mg total) by mouth daily. 30 tablet 2  . hydrOXYzine (ATARAX/VISTARIL) 25 MG tablet Take 1 tablet (25 mg total) by mouth 2 (two) times daily as needed. And at bedtime as needed for sleep 90 tablet 1  . traZODone (DESYREL) 100 MG tablet Take 1 tablet (100 mg total) by mouth at bedtime. For sleep 30 tablet 1   No current facility-administered medications for this visit.       Musculoskeletal: Strength & Muscle Tone: within normal limits Gait & Station: normal Patient leans: N/A  Psychiatric Specialty Exam: Review of Systems  Psychiatric/Behavioral: The patient is nervous/anxious and has insomnia.   All other systems reviewed and are negative.   There were no vitals taken for this visit.There is no height or weight on file to calculate BMI.  General Appearance: Casual  Eye Contact:  Fair  Speech:  Normal Rate  Volume:  Normal  Mood:  Anxious and Irritable  Affect:  Congruent  Thought Process:  Goal Directed and Descriptions of Associations: Intact  Orientation:  Full (Time, Place, and Person)  Thought Content: Logical   Suicidal Thoughts:  No  Homicidal Thoughts:  No  Memory:  Immediate;   Fair Recent;   Fair Remote;   Fair  Judgement:  Fair  Insight:  Fair  Psychomotor Activity:  Normal  Concentration:  Concentration: Fair and Attention Span: Fair  Recall:  [employer]Fair  Fund of Knowledge: Fair  Language: Fair  Akathisia:  No  Handed:  Right  AIMS (if indicated): denies tremors, rigidity  Assets:  Communication Skills Desire for Improvement Housing Intimacy Social Support Talents/Skills Transportation  ADL's:  Intact  Cognition: WNL  Sleep:  poor   Screenings:   Assessment and Plan: Riley Hines is a 33 year old Caucasian male, employed, married, lives in [place], has a history of bipolar disorder, GAD, tobacco use disorder was evaluated by telemedicine today.  Patient is biologically predisposed given his family history as well as history of trauma.  He also has psychosocial stressors current COVID-19 outbreak.  Patient is currently struggling with mood lability and sleep issues.  He will benefit from medication changes as well as psychotherapy sessions.  Plan Bipolar disorder type II-unstable Increase Abilify to 5 mg p.o. daily for 2 weeks and then to 10 mg p.o. daily.  Lexapro 10 mg p.o. daily. Increase trazodone to 100 mg p.o.  nightly as needed for insomnia. Add hydroxyzine 25 mg p.o. twice daily as needed for anxiety and at bedtime as needed for sleep.   GAD-some progress Lexapro 10 mg p.o. daily Patient has upcoming appointment with therapist that she is motivated to keep it.  For tobacco use disorder-improving Provided smoking cessation counseling. Patient continues to cut back.  Follow-up in clinic in 1 month or sooner if needed.  August 18 at 2:45 PM.  However did advise patient to call the clinic back if he has any worsening symptoms or adverse side effects to medications.  I have spent atleast 15 minutes non face to face with patient today. More than 50 % of the time was spent for psychoeducation and supportive psychotherapy and care coordination.  This note was generated in part or whole with voice recognition software. Voice recognition is usually quite accurate but there are transcription errors that can and very often do occur. I apologize for any typographical errors that were not detected and corrected.         Jomarie LongsSaramma Tyshawn Ciullo, MD 10/08/2018, 12:05 PM

## 2018-10-10 ENCOUNTER — Ambulatory Visit: Payer: Commercial Managed Care - PPO | Admitting: Licensed Clinical Social Worker

## 2018-10-10 ENCOUNTER — Other Ambulatory Visit: Payer: Self-pay

## 2018-10-11 ENCOUNTER — Other Ambulatory Visit: Payer: Self-pay

## 2018-10-11 ENCOUNTER — Emergency Department
Admission: EM | Admit: 2018-10-11 | Discharge: 2018-10-11 | Disposition: A | Discharge status: Home / Self Care | Payer: Commercial Managed Care - PPO | Attending: Emergency Medicine | Admitting: Emergency Medicine

## 2018-10-11 DIAGNOSIS — Z79899 Other long term (current) drug therapy: Secondary | ICD-10-CM | POA: Diagnosis not present

## 2018-10-11 DIAGNOSIS — Y92410 Unspecified street and highway as the place of occurrence of the external cause: Secondary | ICD-10-CM | POA: Diagnosis not present

## 2018-10-11 DIAGNOSIS — S161XXA Strain of muscle, fascia and tendon at neck level, initial encounter: Secondary | ICD-10-CM | POA: Diagnosis not present

## 2018-10-11 DIAGNOSIS — Y999 Unspecified external cause status: Secondary | ICD-10-CM | POA: Insufficient documentation

## 2018-10-11 DIAGNOSIS — S199XXA Unspecified injury of neck, initial encounter: Secondary | ICD-10-CM | POA: Diagnosis present

## 2018-10-11 DIAGNOSIS — F17210 Nicotine dependence, cigarettes, uncomplicated: Secondary | ICD-10-CM | POA: Diagnosis not present

## 2018-10-11 DIAGNOSIS — Y9389 Activity, other specified: Secondary | ICD-10-CM | POA: Insufficient documentation

## 2018-10-11 DIAGNOSIS — M7918 Myalgia, other site: Secondary | ICD-10-CM

## 2018-10-11 MED ORDER — CYCLOBENZAPRINE HCL 10 MG PO TABS
10.0000 mg | ORAL_TABLET | Freq: Once | ORAL | Status: AC
  Administered 2018-10-11: 10 mg via ORAL
  Filled 2018-10-11: qty 1

## 2018-10-11 MED ORDER — NAPROXEN 500 MG PO TABS
500.0000 mg | ORAL_TABLET | Freq: Once | ORAL | Status: AC
  Administered 2018-10-11: 500 mg via ORAL
  Filled 2018-10-11: qty 1

## 2018-10-11 MED ORDER — CYCLOBENZAPRINE HCL 10 MG PO TABS: 10.0000 mg | ORAL_TABLET | Freq: Three times a day (TID) | ORAL | 0 refills | Status: DC | PRN

## 2018-10-11 MED ORDER — KETOROLAC TROMETHAMINE 10 MG PO TABS: 10.0000 mg | ORAL_TABLET | Freq: Four times a day (QID) | ORAL | 0 refills | Status: DC | PRN
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
## 2018-10-16 ENCOUNTER — Other Ambulatory Visit: Payer: Self-pay | Admitting: Psychiatry

## 2018-10-16 DIAGNOSIS — F3181 Bipolar II disorder: Secondary | ICD-10-CM

## 2018-11-19 ENCOUNTER — Other Ambulatory Visit: Payer: Self-pay

## 2018-11-19 ENCOUNTER — Encounter: Payer: Self-pay | Admitting: Psychiatry

## 2018-11-19 ENCOUNTER — Ambulatory Visit (INDEPENDENT_AMBULATORY_CARE_PROVIDER_SITE_OTHER): Payer: Commercial Managed Care - PPO | Admitting: Psychiatry

## 2018-11-19 DIAGNOSIS — F3181 Bipolar II disorder: Secondary | ICD-10-CM

## 2018-11-19 DIAGNOSIS — F17200 Nicotine dependence, unspecified, uncomplicated: Secondary | ICD-10-CM

## 2018-11-19 DIAGNOSIS — F411 Generalized anxiety disorder: Secondary | ICD-10-CM | POA: Diagnosis not present

## 2018-11-19 DIAGNOSIS — F5105 Insomnia due to other mental disorder: Secondary | ICD-10-CM | POA: Insufficient documentation

## 2018-11-19 MED ORDER — ARIPIPRAZOLE 15 MG PO TABS: 15.0000 mg | ORAL_TABLET | Freq: Every day | ORAL | 1 refills | Status: DC

## 2018-11-19 MED ORDER — BELSOMRA 5 MG PO TABS: 5.0000 mg | ORAL_TABLET | Freq: Every day | ORAL | 0 refills | Status: DC

## 2018-11-19 NOTE — Progress Notes (Signed)
Virtual Visit via Video Note  I connected with Riley Riley on 11/19/18 at  2:45 PM EDT by a video enabled telemedicine application and verified that I am speaking with the correct person using two identifiers.   I discussed the limitations of evaluation and management by telemedicine and the availability of in person appointments. The patient expressed understanding and agreed to proceed.   I discussed the assessment and treatment plan with the patient. The patient was provided an opportunity to ask questions and all were answered. The patient agreed with the plan and demonstrated an understanding of the instructions.   The patient was advised to call back or seek an in-person evaluation if the symptoms worsen or if the condition fails to improve as anticipated.  BH MD/PA/NP OP Progress Note  11/19/2018 6:15 PM Harvest Stanco  MRN:  [id number]  Chief Complaint:  Chief Complaint    Follow-up     HPI: Riley Riley is a 33 year old Caucasian male, married, employed, lives in [place], has a history of bipolar disorder, GAD, tobacco use disorder was evaluated by telemedicine today.  Patient today reports he was initially doing well on the combination of Abilify and Lexapro.  His mood lability had improved and his anxiety was responding to the Lexapro.  He however reports that since the past couple of weeks he has noticed irritability.  He also reports anxiety symptoms when he feels like crying and also feels like there is a lump in his throat.  He also reports he does not want to stay on the trazodone since he has been acting out in his dreams and also has been having nightmares which he does not remember  in the morning.  He reports his wife has observed this at night and it disrupts her sleep.  Patient however reports trazodone does help him to fall asleep quickly and has helped with his sleep.  Patient denies any suicidality, homicidality or perceptual disturbances.  Patient has been unable to  keep up with his therapy sessions.  Advised him to make a follow-up visit with his therapist as soon as possible.  Also discussed readjusting his medications as well as starting him on a new sleep medication.  Patient agrees with plan. Visit Diagnosis:    ICD-10-CM   1. Bipolar 2 disorder (HCC)  F31.81 ARIPiprazole (ABILIFY) 15 MG tablet    Suvorexant (BELSOMRA) 5 MG TABS   mixed, mild  2. GAD (generalized anxiety disorder)  F41.1   3. Insomnia due to mental condition  F51.05   4. Tobacco use disorder  F17.200     Past Psychiatric History: I have reviewed past psychiatric history from my progress note on 08/08/2018.  Past trials of Lexapro,trazodone ,seroquel.  Past Medical History:  Past Medical History:  Diagnosis Date  . ADHD (attention deficit hyperactivity disorder)   . Anxiety   . Lung blebs [facility])     Past Surgical History:  Procedure Laterality Date  . KNEE SURGERY Left     Family Psychiatric History: I have reviewed family psychiatric history from my progress note on 08/08/2018.  Family History:  Family History  Problem Relation Age of Onset  . Anxiety disorder Father   . Depression Father   . ADD / ADHD Brother     Social History: I have reviewed social history from my progress note on 08/08/2018. Social History   Socioeconomic History  . Marital status: Married    Spouse name: Riley Hines  . Number of children: 3  . Years of  education: Not on file  . Highest education level: High school graduate  Occupational History  . Not on file  Social Needs  . Financial resource strain: Not hard at all  . Food insecurity    Worry: Never true    Inability: Never true  . Transportation needs    Medical: No    Non-medical: No  Tobacco Use  . Smoking status: Current Every Day Smoker    Packs/day: 1.00    Types: Cigarettes  . Smokeless tobacco: Never Used  Substance and Sexual Activity  . Alcohol use: Not Currently    Frequency: Never  . Drug use: Never  . Sexual activity:  Yes  Lifestyle  . Physical activity    Days per week: 0 days    Minutes per session: 0 min  . Stress: Rather much  Relationships  . Social Musicianconnections    Talks on phone: Not on file    Gets together: Not on file    Attends religious service: Never    Active member of club or organization: No    Attends meetings of clubs or organizations: Never    Relationship status: Married  Other Topics Concern  . Not on file  Social History Narrative  . Not on file    Allergies:  Allergies  Allergen Reactions  . Hydrocodone Other (See Comments)    Violent, suicidal thoughts.    . Oxycodone Other (See Comments)    Pt rerpots feeling bugs under the skin, cannot sleep, hallucinations   . Oxycodone-Acetaminophen Other (See Comments)    Violent, suicidal thoughts.     Metabolic Disorder Labs: No results found for: HGBA1C, MPG No results found for: PROLACTIN No results found for: CHOL, TRIG, HDL, CHOLHDL, VLDL, LDLCALC No results found for: TSH  Therapeutic Level Labs: No results found for: LITHIUM No results found for: VALPROATE No components found for:  CBMZ  Current Medications: Current Outpatient Medications  Medication Sig Dispense Refill  . ARIPiprazole (ABILIFY) 15 MG tablet Take 1 tablet (15 mg total) by mouth daily. 30 tablet 1  . cyclobenzaprine (FLEXERIL) 10 MG tablet Take 1 tablet (10 mg total) by mouth 3 (three) times daily as needed. 15 tablet 0  . escitalopram (LEXAPRO) 10 MG tablet Take 1 tablet (10 mg total) by mouth daily. 30 tablet 2  . hydrOXYzine (ATARAX/VISTARIL) 25 MG tablet Take 1 tablet (25 mg total) by mouth 2 (two) times daily as needed. And at bedtime as needed for sleep 90 tablet 1  . ketorolac (TORADOL) 10 MG tablet Take 1 tablet (10 mg total) by mouth every 6 (six) hours as needed. 20 tablet 0  . Suvorexant (BELSOMRA) 5 MG TABS Take 5 mg by mouth at bedtime. 30 tablet 0   No current facility-administered medications for this visit.       Musculoskeletal: Strength & Muscle Tone: UTA Gait & Station: normal Patient leans: N/A  Psychiatric Specialty Exam: Review of Systems  Psychiatric/Behavioral: The patient is nervous/anxious and has insomnia.   All other systems reviewed and are negative.   There were no vitals taken for this visit.There is no height or weight on file to calculate BMI.  General Appearance: Casual  Eye Contact:  Fair  Speech:  Clear and Coherent  Volume:  Normal  Mood:  Anxious  Affect:  Congruent  Thought Process:  Goal Directed and Descriptions of Associations: Intact  Orientation:  Full (Time, Place, and Person)  Thought Content: Logical   Suicidal Thoughts:  No  Homicidal Thoughts:  No  Memory:  Immediate;   Fair Recent;   Fair Remote;   Fair  Judgement:  Fair  Insight:  Fair  Psychomotor Activity:  Normal  Concentration:  Concentration: Fair and Attention Span: Fair  Recall:  FiservFair  Fund of Knowledge: Fair  Language: Fair  Akathisia:  No  Handed:  Right  AIMS (if indicated): denies tremors, rigidity  Assets:  Communication Skills Desire for Improvement Housing Social Support Talents/Skills  ADL's:  Intact  Cognition: WNL  Sleep:  Has nightmares   Screenings:   Assessment and Plan: Tinnie GensJeffrey is a 33 year old Caucasian male, employed, married, lives in [place], has a history of bipolar disorder, GAD, tobacco use disorder was evaluated by telemedicine today.  Patient is biologically predisposed given his family history as well as history of trauma.  He also has psychosocial stressors of current COVID-19 outbreak.  Patient continues to struggle with mood lability, anxiety and adverse side effects to medications.  He will benefit from medication readjustment as well as continued psychotherapy sessions.  Plan For bipolar disorder type II- some progress Increase Abilify to 15 mg p.o. daily Continue Lexapro 10 mg p.o. daily Continue hydroxyzine 25 mg p.o. twice daily as needed  for anxiety symptoms.  GAD- some progress Lexapro 10 mg p.o. daily Patient advised to continue psychotherapy sessions.  For insomnia-unstable Discontinue trazodone.  Patient reports acting out behavior and nightmares when he takes it. Patient also has failed Seroquel in the past. Start Belsomra 5 mg p.o. nightly as needed Continue hydroxyzine 25 mg at bedtime as needed for sleep.  For tobacco use disorder-improving He is trying to cut back. Provided smoking cessation counseling.  Follow-up in clinic in 1 month or sooner if needed.  September 28 at 2:45 PM  I have spent atleast 15 minutes non face to face with patient today. More than 50 % of the time was spent for psychoeducation and supportive psychotherapy and care coordination.  This note was generated in part or whole with voice recognition software. Voice recognition is usually quite accurate but there are transcription errors that can and very often do occur. I apologize for any typographical errors that were not detected and corrected.        Jomarie LongsSaramma Mattheu Brodersen, MD 11/19/2018, 6:15 PM

## 2018-11-21 ENCOUNTER — Telehealth: Payer: Self-pay

## 2018-11-21 MED ORDER — ZOLPIDEM TARTRATE 5 MG PO TABS: 5.0000 mg | ORAL_TABLET | Freq: Every evening | ORAL | 0 refills | Status: DC | PRN

## 2018-11-21 NOTE — Telephone Encounter (Signed)
Sent rx for Ambien 5 mg daily for 10 days to cover until Dr. Charlcie Cradle return. Belsmora declined by insurance.

## 2018-11-21 NOTE — Telephone Encounter (Signed)
received a prior authorization for the belsomra

## 2018-11-21 NOTE — Telephone Encounter (Signed)
Covering psychiatrist note for this patient.  Patient is currently seeing Dr. Shea Evans and was prescribed Belsomra for sleeping difficulties.  Belsomra is not covered by insurance and prior authorization was declined by [employer].  Sending Rx of Ambien 5 mg once at bedtime for 10 days.  Recommend Dr. Shea Evans to follow up on her return.

## 2018-11-21 NOTE — Telephone Encounter (Signed)
pa was submitted and denied states that it is not covered by plan

## 2018-11-26 ENCOUNTER — Telehealth: Payer: Self-pay | Admitting: Psychiatry

## 2018-11-26 NOTE — Telephone Encounter (Signed)
thanks

## 2018-11-26 NOTE — Telephone Encounter (Signed)
Attempted to call patient regarding his sleep medication. Left voicemail.

## 2018-12-03 ENCOUNTER — Other Ambulatory Visit: Payer: Self-pay | Admitting: Psychiatry

## 2018-12-03 DIAGNOSIS — F3181 Bipolar II disorder: Secondary | ICD-10-CM

## 2018-12-05 ENCOUNTER — Ambulatory Visit: Payer: Commercial Managed Care - PPO | Admitting: Licensed Clinical Social Worker

## 2018-12-07 ENCOUNTER — Other Ambulatory Visit: Payer: Self-pay | Admitting: Psychiatry

## 2018-12-07 DIAGNOSIS — F3181 Bipolar II disorder: Secondary | ICD-10-CM

## 2018-12-12 ENCOUNTER — Other Ambulatory Visit: Payer: Self-pay | Admitting: Psychiatry

## 2018-12-12 DIAGNOSIS — F411 Generalized anxiety disorder: Secondary | ICD-10-CM

## 2018-12-12 DIAGNOSIS — F3181 Bipolar II disorder: Secondary | ICD-10-CM

## 2018-12-30 ENCOUNTER — Ambulatory Visit: Payer: Commercial Managed Care - PPO | Admitting: Psychiatry

## 2019-01-03 ENCOUNTER — Ambulatory Visit (INDEPENDENT_AMBULATORY_CARE_PROVIDER_SITE_OTHER): Payer: Self-pay | Admitting: Psychiatry

## 2019-01-03 ENCOUNTER — Other Ambulatory Visit: Payer: Self-pay

## 2019-01-03 DIAGNOSIS — Z5329 Procedure and treatment not carried out because of patient's decision for other reasons: Secondary | ICD-10-CM

## 2019-01-03 DIAGNOSIS — Z91199 Patient's noncompliance with other medical treatment and regimen due to unspecified reason: Secondary | ICD-10-CM | POA: Insufficient documentation

## 2019-01-03 NOTE — Progress Notes (Signed)
No response to call or text. 

## 2020-09-27 IMAGING — CR DG HAND COMPLETE 3+V*L*
1 series · 3 of 3 positions shown · non-contrast
Comparison: None.

CLINICAL DATA: Injury

EXAM:
LEFT HAND - COMPLETE 3+ VIEW

[Series 1: dg hand complete left · 0.14mm/px · 3 of 3 slices shown]
[im 1/3]
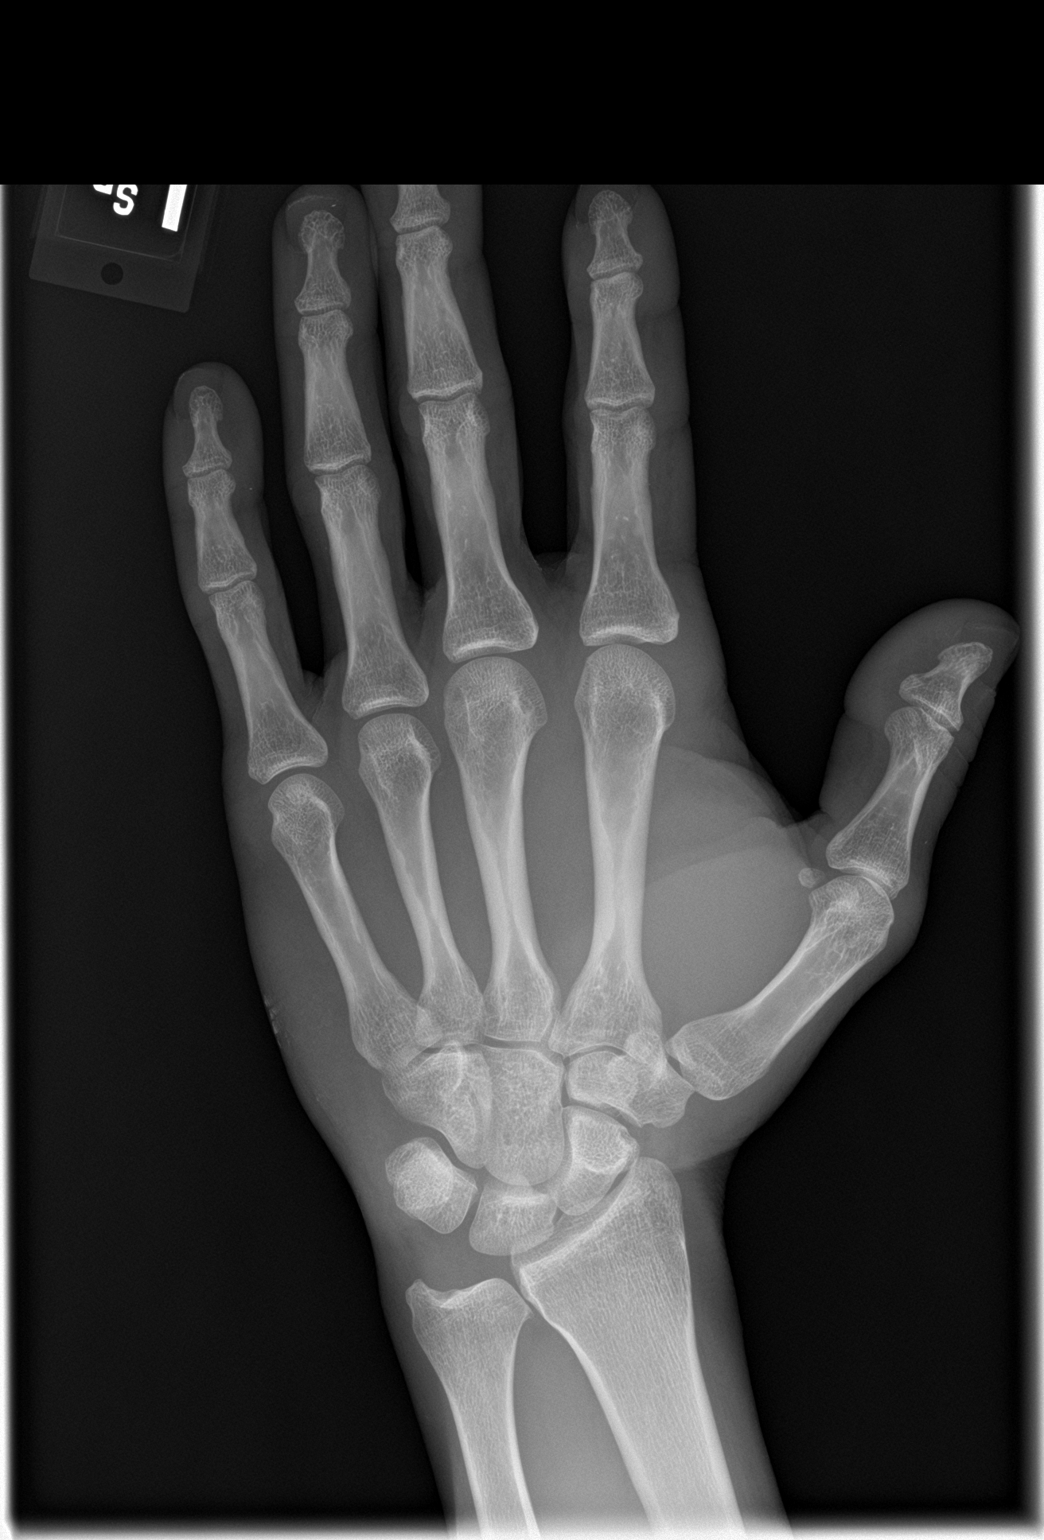
[im 2/3]
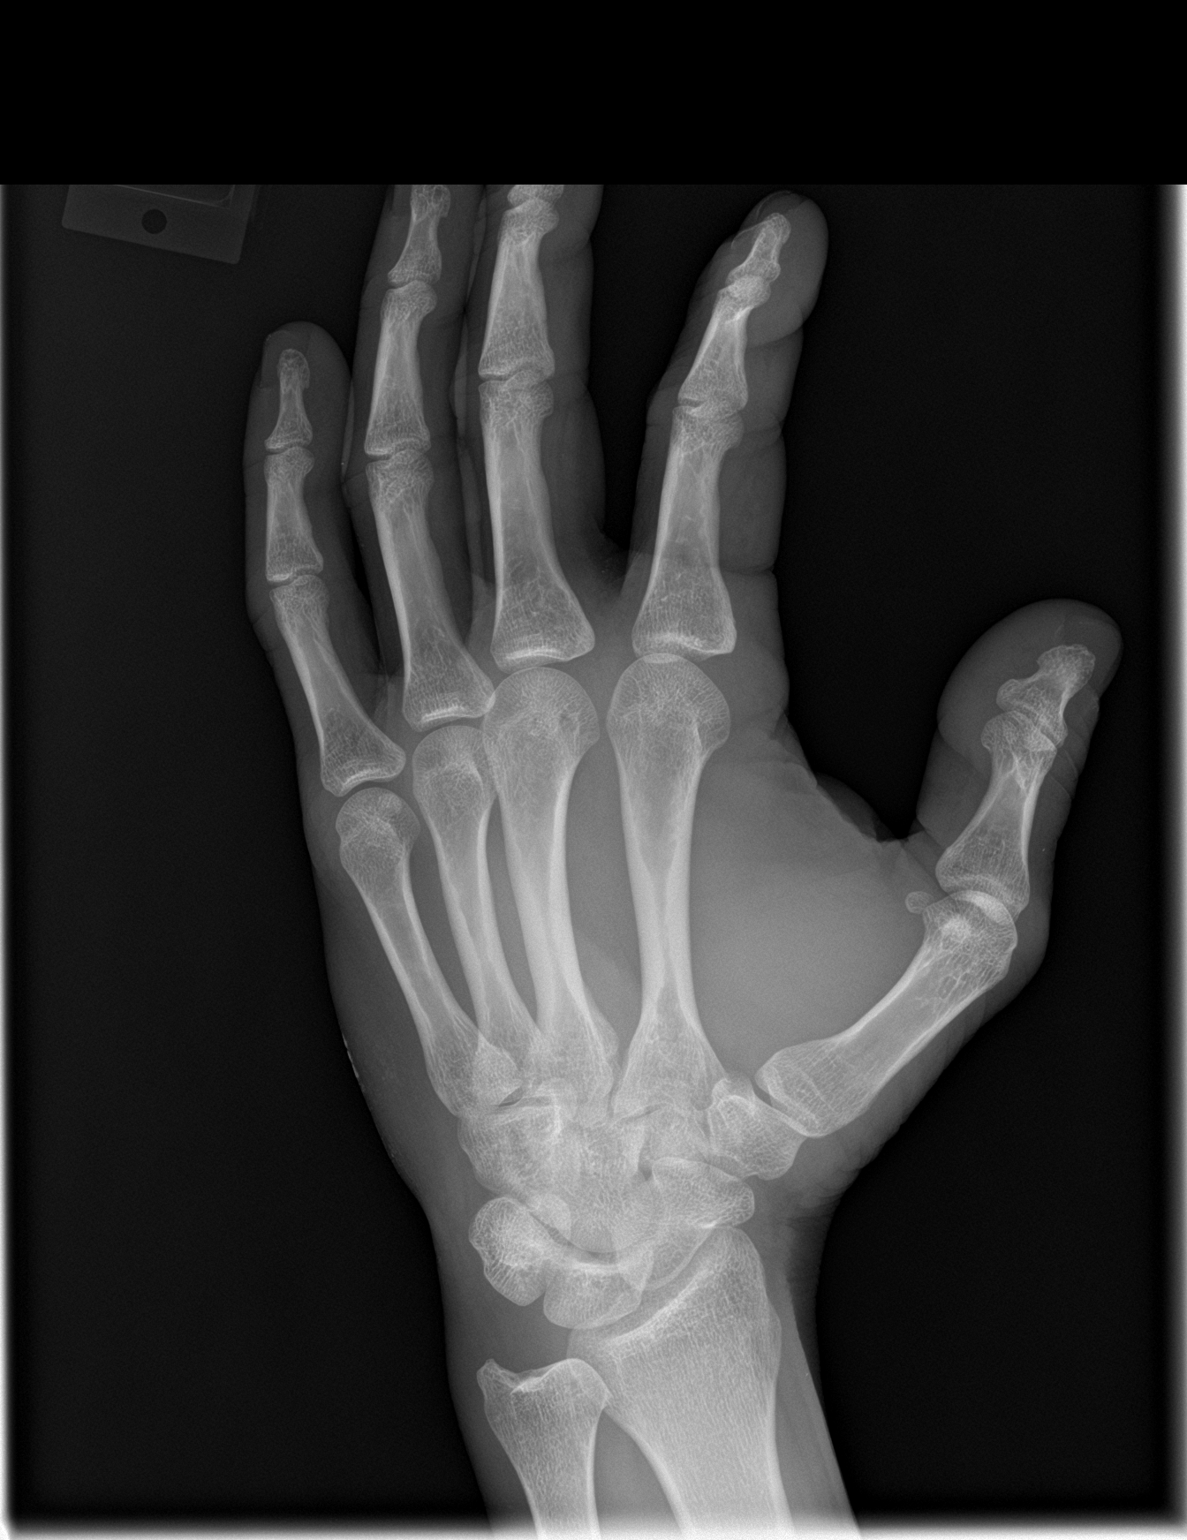
[im 3/3]
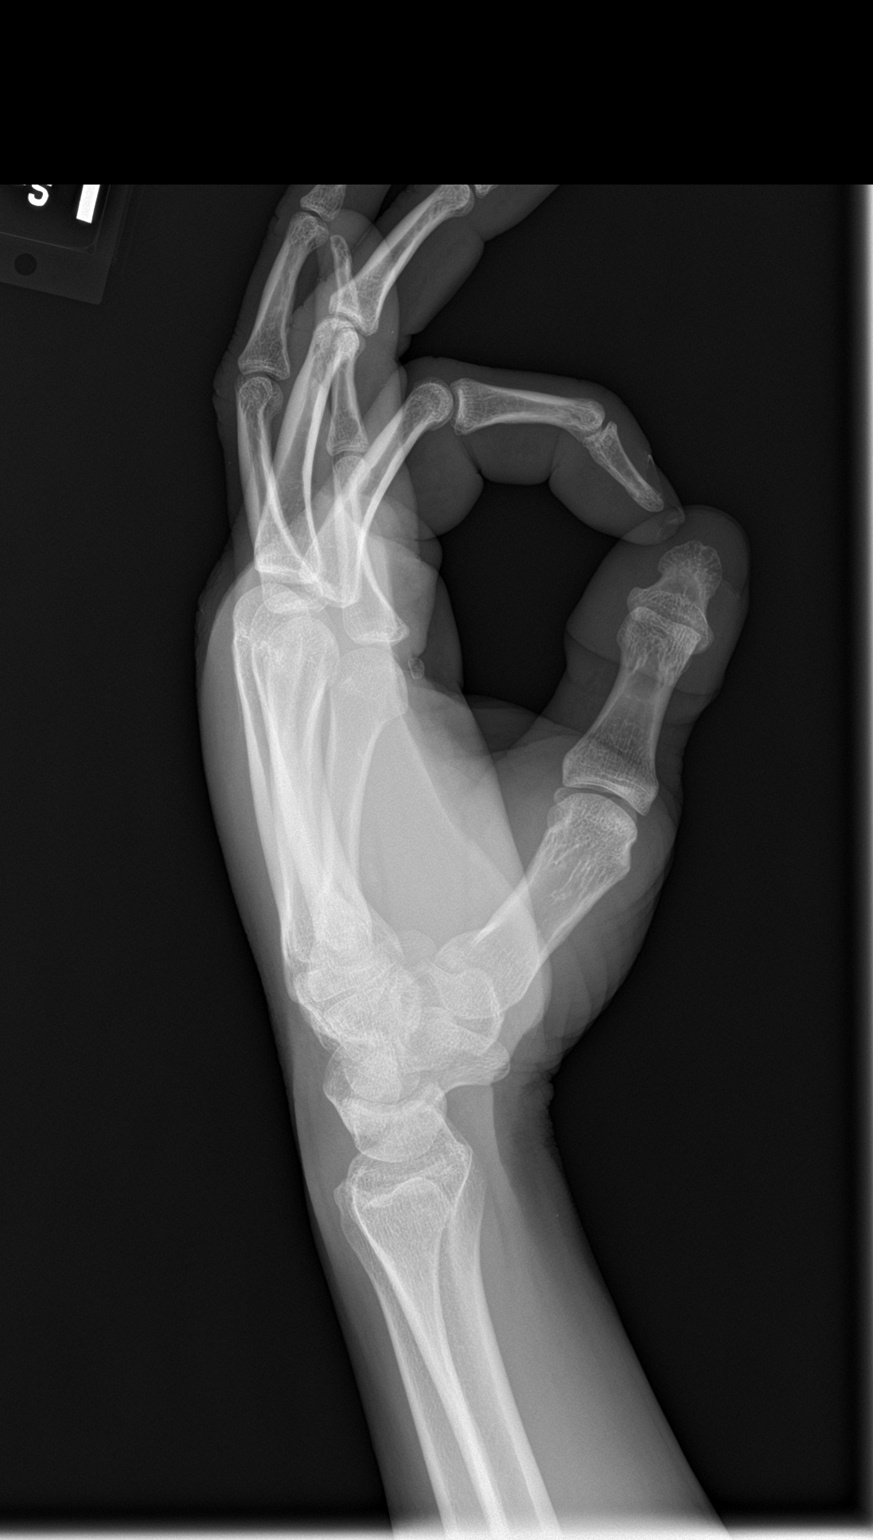

[3 of 3 positions shown; findings below may reference images not displayed]

FINDINGS: There is lucency at the tuft of the third distal phalanx. No
subluxation. Radiopaque material at the nail beds. Radiopaque
material along the skin surface adjacent to the fifth metacarpal.
IMPRESSION: 1. Small lucency at the tuft of the third distal phalanx, possible
remote injury, correlate for focal tenderness.
2. Otherwise no acute osseous abnormality.

## 2020-10-06 IMAGING — CT CT NECK W/ CM
4 of 6 series · 13 of 33 positions shown, 15 images · IV contrast (iopamidol)
Comparison: Cervical spine radiographs 02/02/2011

CLINICAL DATA: Abnormal x-ray. Nonpalpable mass of the neck.
Difficulty swallowing.

EXAM:
CT NECK WITH CONTRAST
TECHNIQUE: Multidetector CT imaging of the neck was performed using the
standard protocol following the bolus administration of intravenous
contrast.
CONTRAST:  75mL 5T8JJD-RPP IOPAMIDOL (5T8JJD-RPP) INJECTION 61%

[Series 3: axial bone neck · axial · 0.52mm/px · z∈[-801,-713]mm · 2 of 134 slices shown]
[im 45/134  bone]
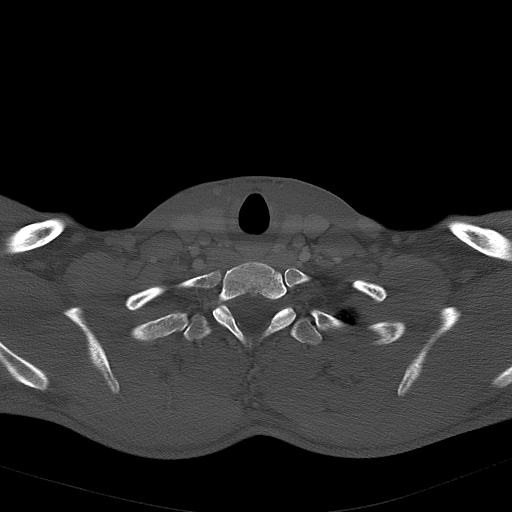
[im 89/134  bone]
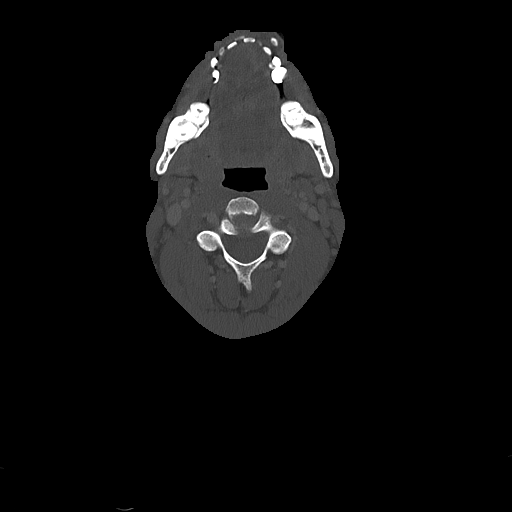

[Series 5: coronal neck neck (person_name) · coronal · 0.46mm/px · 3 of 129 slices shown]
[im 41/129  bone]
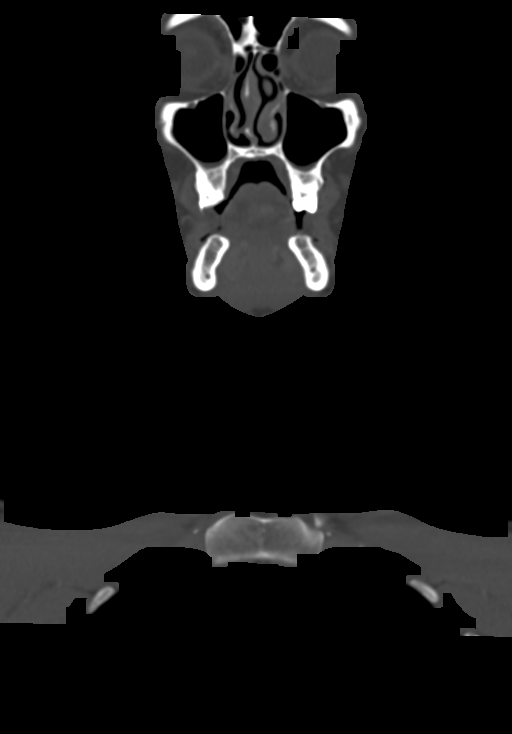
[im 57/129  bone]
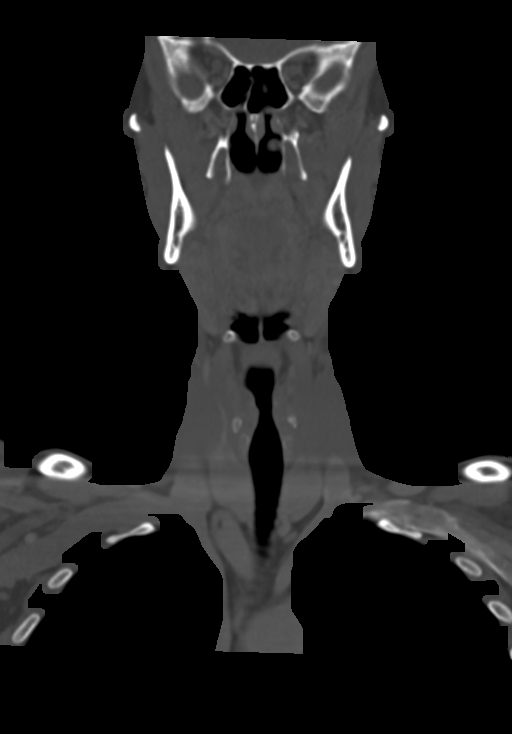
[im 73/129  bone]
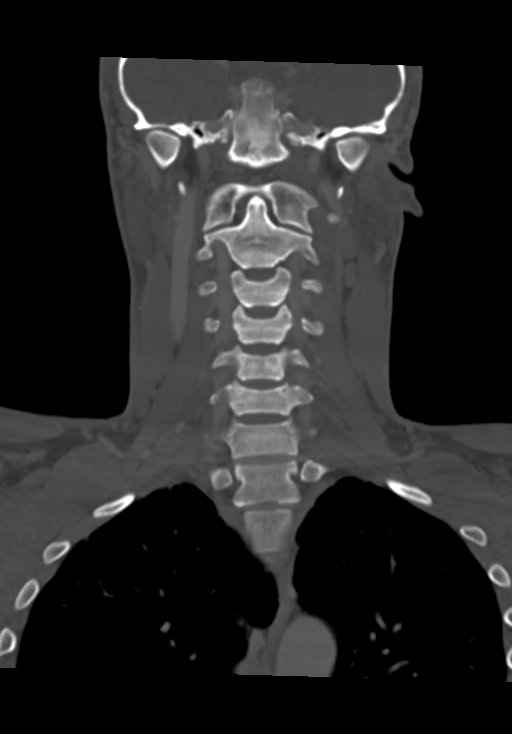

[Series 7: sagittal neck neck (person_name) · sagittal · 0.52mm/px · 5 of 151 slices shown, 6 images]
[im 51/151  bone]
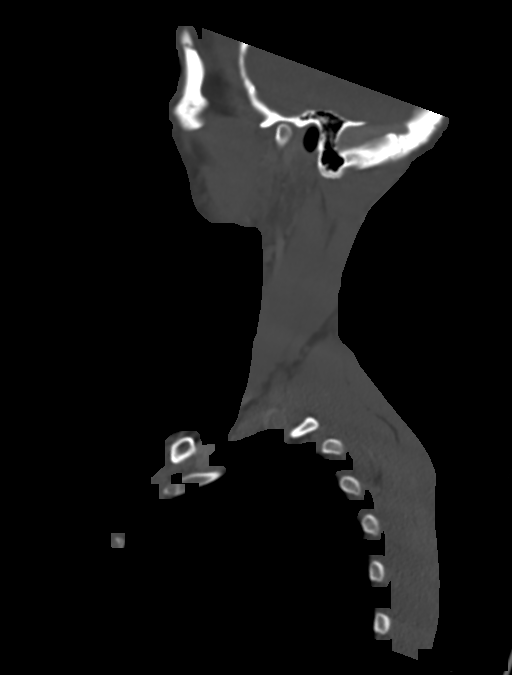
[im 63/151  bone]
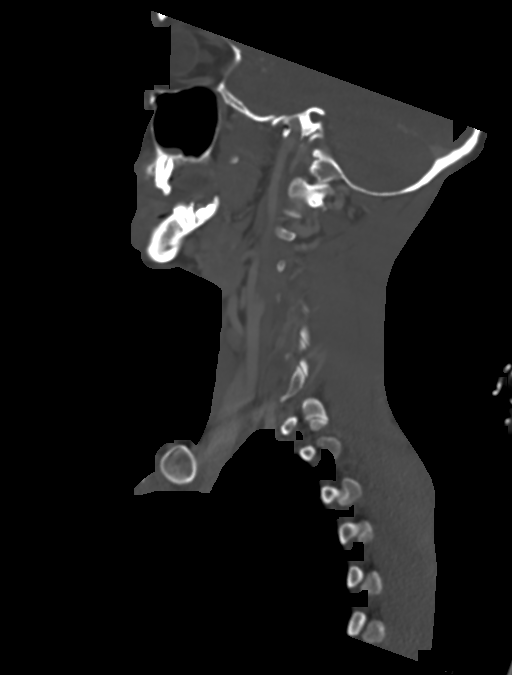
[im 76/151  soft-tissue]
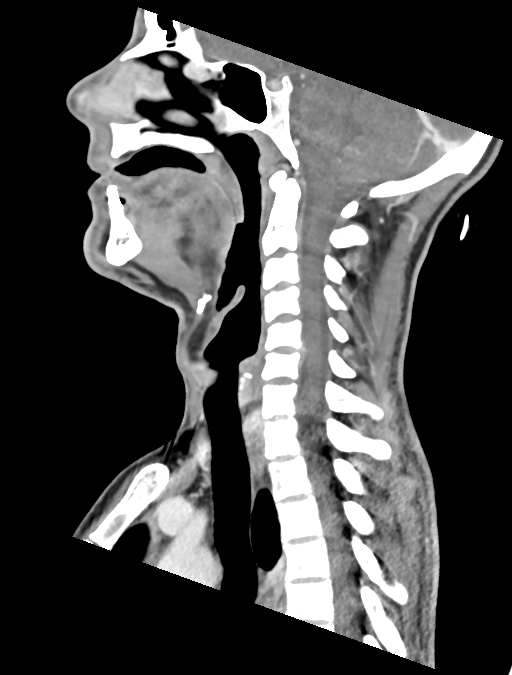
[im 76/151  bone]
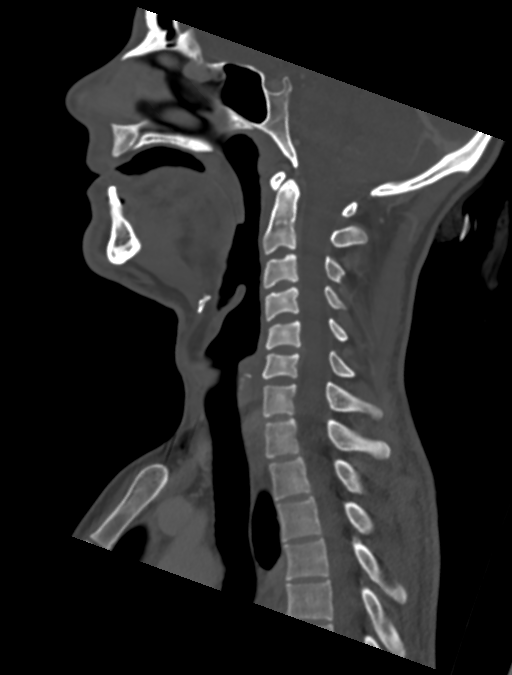
[im 88/151  bone]
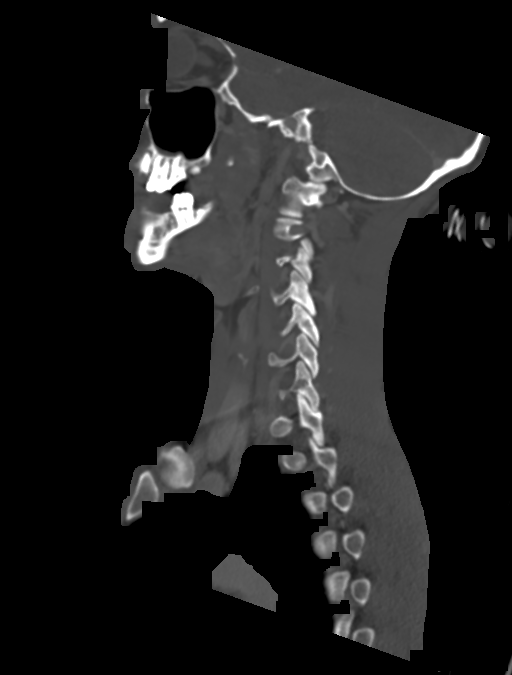
[im 101/151  bone]
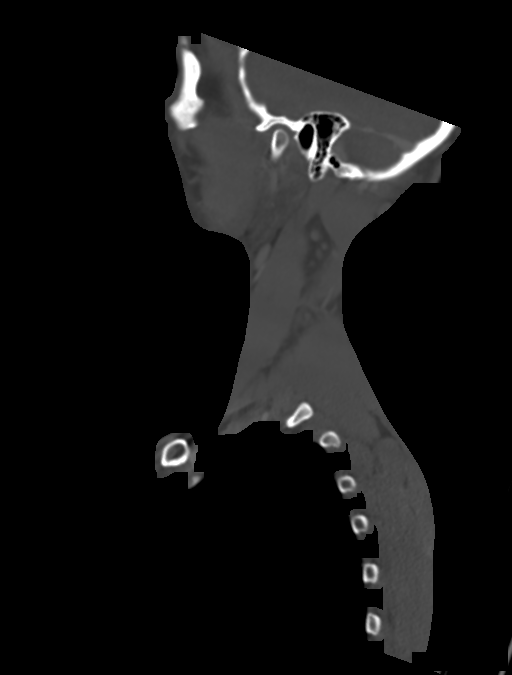

[Series 9: ax oropharynx neck neck (person_name) · axial · 0.52mm/px · z∈[-890,-731]mm · 3 of 170 slices shown, 4 images]
[im 43/170  soft-tissue]
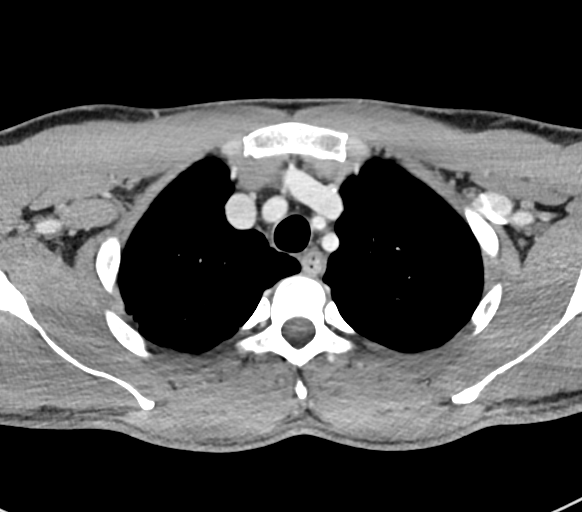
[im 43/170  bone]
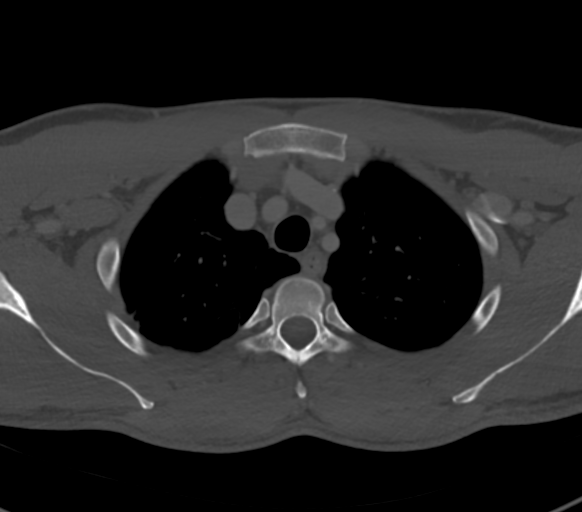
[im 85/170  bone]
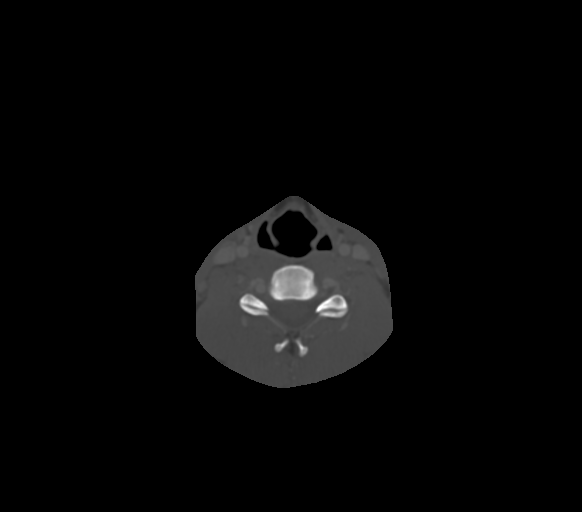
[im 127/170  bone]
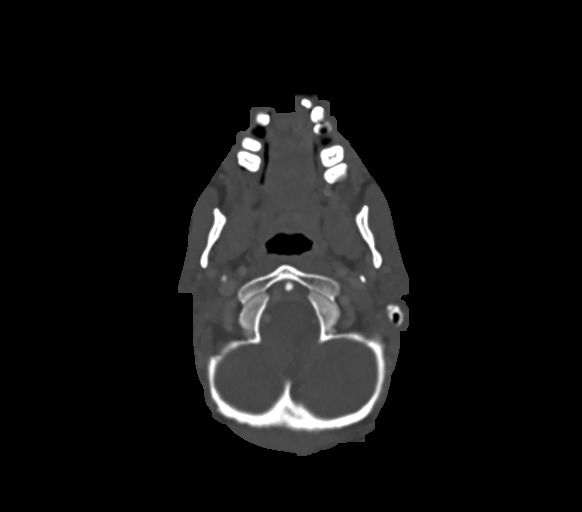

[13 of 33 positions shown; findings below may reference images not displayed]

FINDINGS: Pharynx and larynx: No focal mucosal or submucosal lesions are
present.

Nasopharynx and soft palate are normal.

Palatine tonsils and tongue base are within normal limits.

Epiglottis and hypopharynx are normal. Vocal cords are midline and
symmetric.

Prevertebral soft tissues are normal.  Esophagus is unremarkable.

Salivary glands: The submandibular and parotid glands are normal
bilaterally.

Thyroid: Normal

Lymph nodes: No significant cervical adenopathy present.

Vascular: No focal vascular lesions are present.

Limited intracranial: Imaged portions of the brain are within normal
limits.

Visualized orbits: Bilateral lens replacements are present. Globes
and orbits are within normal limits.

Mastoids and visualized paranasal sinuses: The paranasal sinuses and
mastoid air cells are clear.

Skeleton: Vertebral body heights alignment are normal. No focal
lytic or blastic lesions are present. Skull base is within normal
limits.

Upper chest: Bullous changes are noted at the lung apices
bilaterally. There is no pneumothorax no focal nodule or mass lesion
is present.
IMPRESSION: 1. Normal prevertebral soft tissues and trachea.
2. Bullous changes at the lung apices bilaterally may have
contributed to abnormal appearance of the cervical spine
radiographs. Comparison films are not available.

## 2020-12-30 ENCOUNTER — Other Ambulatory Visit
Admission: RE | Admit: 2020-12-30 | Discharge: 2020-12-30 | Disposition: A | Discharge status: Home / Self Care | Payer: Commercial Managed Care - PPO | Source: Ambulatory Visit | Attending: Family Medicine | Admitting: Family Medicine

## 2020-12-30 ENCOUNTER — Other Ambulatory Visit: Payer: Self-pay | Admitting: Family Medicine

## 2020-12-30 ENCOUNTER — Ambulatory Visit
Admission: RE | Admit: 2020-12-30 | Discharge: 2020-12-30 | Disposition: A | Discharge status: Home / Self Care | Payer: Commercial Managed Care - PPO | Source: Ambulatory Visit | Attending: Family Medicine | Admitting: Family Medicine

## 2020-12-30 ENCOUNTER — Other Ambulatory Visit: Payer: Self-pay

## 2020-12-30 ENCOUNTER — Other Ambulatory Visit (HOSPITAL_COMMUNITY): Payer: Self-pay | Admitting: Family Medicine

## 2020-12-30 DIAGNOSIS — Z8616 Personal history of COVID-19: Secondary | ICD-10-CM

## 2020-12-30 DIAGNOSIS — R7989 Other specified abnormal findings of blood chemistry: Secondary | ICD-10-CM

## 2020-12-30 DIAGNOSIS — R0602 Shortness of breath: Secondary | ICD-10-CM | POA: Insufficient documentation

## 2020-12-30 LAB — BRAIN NATRIURETIC PEPTIDE: B Natriuretic Peptide: 8.8 pg/mL (ref 0.0–100.0)

## 2020-12-30 LAB — D-DIMER, QUANTITATIVE: D-Dimer, Quant: 0.57 ug/mL-FEU — ABNORMAL HIGH (ref 0.00–0.50)

## 2020-12-30 MED ORDER — IOHEXOL 350 MG/ML SOLN
75.0000 mL | Freq: Once | INTRAVENOUS | Status: AC | PRN
  Administered 2020-12-30: 75 mL via INTRAVENOUS

## 2022-05-15 ENCOUNTER — Other Ambulatory Visit: Payer: Self-pay | Admitting: Family Medicine

## 2022-05-15 DIAGNOSIS — M5432 Sciatica, left side: Secondary | ICD-10-CM

## 2022-05-15 DIAGNOSIS — M545 Low back pain, unspecified: Secondary | ICD-10-CM

## 2022-06-05 ENCOUNTER — Ambulatory Visit
Admission: RE | Admit: 2022-06-05 | Discharge: 2022-06-05 | Disposition: A | Discharge status: Home / Self Care | Payer: Commercial Managed Care - PPO | Source: Ambulatory Visit | Attending: Family Medicine | Admitting: Family Medicine

## 2022-06-05 DIAGNOSIS — M545 Low back pain, unspecified: Secondary | ICD-10-CM

## 2022-06-05 DIAGNOSIS — M5432 Sciatica, left side: Secondary | ICD-10-CM

## 2022-09-13 ENCOUNTER — Inpatient Hospital Stay
Admission: RE | Admit: 2022-09-13 | Discharge: 2022-09-13 | Disposition: A | Discharge status: Home / Self Care | Payer: Self-pay | Source: Ambulatory Visit | Attending: Orthopedic Surgery | Admitting: Orthopedic Surgery

## 2022-09-13 ENCOUNTER — Other Ambulatory Visit: Payer: Self-pay

## 2022-09-13 DIAGNOSIS — Z049 Encounter for examination and observation for unspecified reason: Secondary | ICD-10-CM

## 2022-09-18 NOTE — Progress Notes (Addendum)
Referring Physician:  Wilford Corner, PA-C [id number] [place],  [place] [address]  Primary Physician:  Wilford Corner, PA-C  History of Present Illness: 09/20/2022 [name] has a history of CAD, MI in 2012, migraines, and emphysema.   Has been seeing PCP for back and left leg pain. Saw a neurosurgeon in [place] and was told he may be candidate for spinal fusion but he had to pay $6000 upfront. His insurance told him to get a second opinion.   He has about 1 year history of intermittent LBP with  bilateral leg pain to his feet (entire leg) that is worse with walking, sitting, and standing. No alleviating factors. He has weakness in the legs. He has numbness and tingling in his legs as well.   He also has 4-5 month history of constant numbness and tingling in both arms from his shoulder into his hands. Varies in severity. He notes weakness in his hands. No balance issues or dexterity issues. Minimal neck pain. No arm pain. History of injury to his neck when wrestling in high school.   He is taking hydrocodone and neurontin with some relief.   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   He smokes 1/2ppd x 18 years.   Bowel/Bladder Dysfunction: none  Conservative measures:  Physical therapy: none Multimodal medical therapy including regular antiinflammatories: neurontin, norco,  Injections: No epidural steroid injections  Past Surgery: None  Anh Hoots has no symptoms of cervical myelopathy.  The symptoms are causing a significant impact on the patient's life.   Review of Systems:  A 10 point review of systems is negative, except for the pertinent positives and negatives detailed in the HPI.  Past Medical History: Past Medical History:  Diagnosis Date   ADHD (attention deficit hyperactivity disorder)    Anxiety    Lung blebs (HCC)     Past Surgical History: Past Surgical History:  Procedure Laterality Date   KNEE SURGERY  Left     Allergies: Allergies as of 09/20/2022 - Review Complete 11/19/2018  Allergen Reaction Noted   Hydrocodone Other (See Comments) 06/03/2017   Oxycodone Other (See Comments) 06/03/2017   Oxycodone-acetaminophen Other (See Comments) 04/30/2018    Medications: Outpatient Encounter Medications as of 09/20/2022  Medication Sig   albuterol (VENTOLIN HFA) 108 (90 Base) MCG/ACT inhaler Inhale into the lungs.   fluticasone-salmeterol (ADVAIR) 500-50 MCG/ACT AEPB Inhale into the lungs.   gabapentin (NEURONTIN) 100 MG capsule Take by mouth.   ARIPiprazole (ABILIFY) 15 MG tablet Take 1 tablet (15 mg total) by mouth daily.   cyclobenzaprine (FLEXERIL) 10 MG tablet Take 1 tablet (10 mg total) by mouth 3 (three) times daily as needed.   escitalopram (LEXAPRO) 20 MG tablet Take 20 mg by mouth daily.   HYDROcodone-acetaminophen (NORCO/VICODIN) 5-325 MG tablet Take 1 tablet by mouth every 6 (six) hours as needed.   hydrOXYzine (ATARAX/VISTARIL) 25 MG tablet Take 1 tablet (25 mg total) by mouth 2 (two) times daily as needed. And at bedtime as needed for sleep   ketorolac (TORADOL) 10 MG tablet Take 1 tablet (10 mg total) by mouth every 6 (six) hours as needed.   QUEtiapine (SEROQUEL) 25 MG tablet Take 25 mg by mouth at bedtime.   zolpidem (AMBIEN) 5 MG tablet Take 1 tablet (5 mg total) by mouth at bedtime as needed for sleep.   [DISCONTINUED] escitalopram (LEXAPRO) 10 MG tablet TAKE 1 TABLET BY MOUTH EVERY DAY   No  facility-administered encounter medications on file as of 09/20/2022.    Social History: Social History   Tobacco Use   Smoking status: Every Day    Packs/day: 1    Types: Cigarettes   Smokeless tobacco: Never   Tobacco comments:    1 pack every two days   Vaping Use   Vaping Use: Former   Quit date: 02/07/2018  Substance Use Topics   Alcohol use: Not Currently   Drug use: Never    Family Medical History: Family History  Problem Relation Age of Onset   Anxiety disorder  Father    Depression Father    ADD / ADHD Brother     Physical Examination: Vitals:   09/20/22 1303  BP: 110/70    General: Patient is well developed, well nourished, calm, collected, and in no apparent distress. Attention to examination is appropriate.  Respiratory: Patient is breathing without any difficulty.   NEUROLOGICAL:     Awake, alert, oriented to person, place, and time.  Speech is clear and fluent. Fund of knowledge is appropriate.   Cranial Nerves: Pupils equal round and reactive to light.  Facial tone is symmetric.    Lower central posterior lumbar tenderness.   No abnormal lesions on exposed skin.   Strength: Side Biceps Triceps Deltoid Interossei Grip Wrist Ext. Wrist Flex.  R 5 5 5 5 5 5 5   L 5 5 5 5 5 5 5    Side Iliopsoas Quads Hamstring PF DF EHL  R 5 5 5 5 5 5   L 5 5 5 5 5 5    Reflexes are 2+ and symmetric at the biceps, triceps, brachioradialis, patella and achilles.   Hoffman's is absent.  Clonus is not present.   Bilateral upper and lower extremity sensation is intact to light touch, slightly diminished in left deltoid region compared to right.   Gait is normal.    Medical Decision Making  Imaging: MRI of lumbar spine dated 06/05/22:  FINDINGS: Segmentation: Conventional numbering is assumed with 5 non-rib-bearing, lumbar type vertebral bodies.   Alignment:  Unchanged grade 1 anterolisthesis of L5 on S1.   Vertebrae: Bilateral L5 pars defects. Modic type 1 degenerative endplate marrow signal changes at L5-S1. Modic type 2 degenerative endplate marrow signal changes at L4-5.   Conus medullaris and cauda equina: Conus extends to the L1 level. Conus and cauda equina appear normal.   Paraspinal and other soft tissues: Unremarkable.   Disc levels:   Mild degenerative changes of the bilateral SI joints.   T12-L1:  Normal.   L1-L2:  Normal.   L2-L3:  Normal.   L3-L4:  Normal.   L4-L5: Small disc bulge with central annular fissure.  No spinal canal stenosis. Mild bilateral facet arthropathy contributes to mild bilateral neural foraminal narrowing.   L5-S1: Spondylolysis with grade 1 anterolisthesis resulting in severe bilateral neural foraminal narrowing. No spinal canal stenosis.   IMPRESSION: 1. Bilateral L5 pars defects with grade 1 anterolisthesis of L5 on S1 resulting in severe bilateral neural foraminal narrowing. 2. Mild bilateral SI joint degeneration.     Electronically Signed   By: Orvan Falconer M.D.   On: 06/06/2022 10:46  I have personally reviewed the images and agree with the above interpretation.   Xrays of lumbar spine dated 05/02/22:  Slip at L5-S1 with bilateral L5 pars and DDD  No xray report available for above xrays.    Assessment and Plan: Mr. Passey is a pleasant 37 y.o. male has 1 year history  of intermittent LBP with  bilateral leg pain to his feet (entire leg) that is worse with walking, sitting, and standing.   He has known slip at L5-S1 with severe bilateral foraminal stenosis. Also with disc bulge L4-L5 with mild bilateral foraminal stenosis. Primary pain generator is likely L5-S1.   He also has 4-5 month history of constant numbness and tingling in both arms from his shoulder into his hands. No neck or arm pain.   Treatment options discussed with patient and following plan made:   - Order for physical therapy for lumbar spine to [facility].  - Lumbar xrays with flexion and extension ordered.  - Can consider upper extremity EMG to evaluate numbness and tingling. He wants to deal with lower back first.  - Discussed lumbar injections. He declines for now.  - He is interested in surgery options. If no improvement with above, he is likely a surgical candidate. Will review with Dr. Myer Haff and message/call him.  - He would need to quit smoking prior to any lumbar surgery.  - Follow up with me in 6 weeks for recheck.   I spent a total of 35 minutes in face-to-face and  non-face-to-face activities related to this patient's care today including review of outside records, review of imaging, review of symptoms, physical exam, discussion of differential diagnosis, discussion of treatment options, and documentation.   Thank you for involving me in the care of this patient.   ADDENDUM 09/21/22:  Reviewed with Dr. Myer Haff. He would consider surgery if he fails PT- would be fusion at L5-S1 (either TLIF or ALIF). He would need to quit smoking 30 days prior to surgery and would be nicotine tested at his preop. I called him and we discussed. He will quit smoking and keep f/u as scheduled.   Drake Leach PA-C Dept. of Neurosurgery

## 2022-09-20 ENCOUNTER — Ambulatory Visit (INDEPENDENT_AMBULATORY_CARE_PROVIDER_SITE_OTHER): Payer: Commercial Managed Care - PPO | Admitting: Orthopedic Surgery

## 2022-09-20 ENCOUNTER — Encounter: Payer: Self-pay | Admitting: Orthopedic Surgery

## 2022-09-20 VITALS — BP 110/70 | Ht 65.0 in | Wt 162.8 lb

## 2022-09-20 DIAGNOSIS — M5416 Radiculopathy, lumbar region: Secondary | ICD-10-CM

## 2022-09-20 DIAGNOSIS — F17210 Nicotine dependence, cigarettes, uncomplicated: Secondary | ICD-10-CM | POA: Diagnosis not present

## 2022-09-20 DIAGNOSIS — M4726 Other spondylosis with radiculopathy, lumbar region: Secondary | ICD-10-CM

## 2022-09-20 DIAGNOSIS — M47816 Spondylosis without myelopathy or radiculopathy, lumbar region: Secondary | ICD-10-CM

## 2022-09-20 DIAGNOSIS — M48061 Spinal stenosis, lumbar region without neurogenic claudication: Secondary | ICD-10-CM | POA: Diagnosis not present

## 2022-09-20 DIAGNOSIS — M4316 Spondylolisthesis, lumbar region: Secondary | ICD-10-CM

## 2022-09-20 NOTE — Patient Instructions (Signed)
It was so nice to see you today. Thank you so much for coming in.    You have a slip at L5-S1 with irritation of the nerves on both sides. This is likely causing your back and leg pain.   I ordered xrays of your lower back. You can get these at [facility] (building with the white pillars) off of [address]. The address is [address] [place], [place] [address]. You do not need any appointment.   I sent physical therapy orders to [facility] in [place]. You can call them at [phone] if you don't hear from them to schedule your visit. They are at [address] (in [place]).   You would need to quit smoking prior to surgery for your back. At least 2 weeks before and for 3-6 months after surgery would be best.   I will review everything with Dr. Myer Haff and message or call you with his recommendations.   I will see you back in 6-8 weeks. Please do not hesitate to call if you have any questions or concerns. You can also message me in MyChart.   Drake Leach PA-C [phone]

## 2022-09-21 ENCOUNTER — Encounter: Payer: Self-pay | Admitting: Orthopedic Surgery

## 2022-09-21 NOTE — Telephone Encounter (Signed)
error 

## 2022-10-20 ENCOUNTER — Telehealth: Payer: Self-pay | Admitting: Orthopedic Surgery

## 2022-10-20 NOTE — Telephone Encounter (Signed)
He has been discharged from PT. Notes to be scanned into chart.   If he is ready to quit smoking, then I would have him follow up with Myer Haff and cancel visit with me. He also needs to get the lumbar flex/ext xrays I ordered prior to his visit.   Please let him know.

## 2022-10-20 NOTE — Telephone Encounter (Signed)
No answer and unable to leave message, mail box not set up yet.

## 2022-10-23 NOTE — Telephone Encounter (Signed)
He has an appt with his PCP tomorrow to discuss quit smoking. He will have his xray's tomorrow while he is over there. We agreed that I will call him at the end of next week and follow-up if he has been about to reduce or quit smoking.

## 2022-11-02 NOTE — Telephone Encounter (Signed)
No answer, unable to leave message °

## 2022-11-07 NOTE — Telephone Encounter (Signed)
He saw PCP on 10/24/22 to discuss quit smoking. Will follow-up in an few weeks.

## 2022-11-13 ENCOUNTER — Ambulatory Visit: Payer: Commercial Managed Care - PPO | Admitting: Orthopedic Surgery

## 2022-11-23 NOTE — Telephone Encounter (Signed)
No answer, unable to leave message because mail box is not set up yet.

## 2022-11-23 NOTE — Telephone Encounter (Signed)
Patient has tried quit smoking. He is scheduled for a nicotine test on Monday. He will call the office next week.

## 2023-02-25 ENCOUNTER — Other Ambulatory Visit: Payer: Self-pay

## 2023-02-25 ENCOUNTER — Emergency Department: Payer: Commercial Managed Care - PPO

## 2023-02-25 ENCOUNTER — Emergency Department
Admission: EM | Admit: 2023-02-25 | Discharge: 2023-02-25 | Disposition: A | Discharge status: Home / Self Care | Payer: Commercial Managed Care - PPO | Attending: Emergency Medicine | Admitting: Emergency Medicine

## 2023-02-25 DIAGNOSIS — M542 Cervicalgia: Secondary | ICD-10-CM | POA: Insufficient documentation

## 2023-02-25 DIAGNOSIS — R519 Headache, unspecified: Secondary | ICD-10-CM | POA: Diagnosis not present

## 2023-02-25 DIAGNOSIS — Y92410 Unspecified street and highway as the place of occurrence of the external cause: Secondary | ICD-10-CM | POA: Insufficient documentation

## 2023-02-25 DIAGNOSIS — M545 Low back pain, unspecified: Secondary | ICD-10-CM | POA: Diagnosis present

## 2023-02-25 MED ORDER — KETOROLAC TROMETHAMINE 30 MG/ML IJ SOLN
30.0000 mg | Freq: Once | INTRAMUSCULAR | Status: AC
  Administered 2023-02-25: 30 mg via INTRAMUSCULAR
  Filled 2023-02-25: qty 1

## 2023-02-25 MED ORDER — LIDOCAINE 5 % EX PTCH
1.0000 | MEDICATED_PATCH | CUTANEOUS | Status: DC
  Administered 2023-02-25: 1 via TRANSDERMAL
  Filled 2023-02-25: qty 1

## 2023-02-25 MED ORDER — CYCLOBENZAPRINE HCL 5 MG PO TABS: 5.0000 mg | ORAL_TABLET | Freq: Three times a day (TID) | ORAL | 0 refills | Status: DC | PRN
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
## 2023-02-25 NOTE — ED Notes (Signed)
CCollar applied in triage

## 2023-07-05 ENCOUNTER — Emergency Department (HOSPITAL_COMMUNITY)
Admission: EM | Admit: 2023-07-05 | Discharge: 2023-07-05 | Disposition: A | Discharge status: Home / Self Care | Attending: Emergency Medicine | Admitting: Emergency Medicine

## 2023-07-05 ENCOUNTER — Encounter (HOSPITAL_COMMUNITY): Payer: Self-pay

## 2023-07-05 ENCOUNTER — Other Ambulatory Visit: Payer: Self-pay

## 2023-07-05 ENCOUNTER — Emergency Department (HOSPITAL_COMMUNITY)

## 2023-07-05 DIAGNOSIS — S20211A Contusion of right front wall of thorax, initial encounter: Secondary | ICD-10-CM | POA: Insufficient documentation

## 2023-07-05 DIAGNOSIS — S299XXA Unspecified injury of thorax, initial encounter: Secondary | ICD-10-CM | POA: Diagnosis present

## 2023-07-05 DIAGNOSIS — M542 Cervicalgia: Secondary | ICD-10-CM | POA: Insufficient documentation

## 2023-07-05 DIAGNOSIS — M546 Pain in thoracic spine: Secondary | ICD-10-CM | POA: Diagnosis not present

## 2023-07-05 DIAGNOSIS — Y92410 Unspecified street and highway as the place of occurrence of the external cause: Secondary | ICD-10-CM | POA: Insufficient documentation

## 2023-07-05 DIAGNOSIS — S80811A Abrasion, right lower leg, initial encounter: Secondary | ICD-10-CM | POA: Diagnosis not present

## 2023-07-05 LAB — CBC
HCT: 39.4 % (ref 39.0–52.0)
Hemoglobin: 13.2 g/dL (ref 13.0–17.0)
MCH: 31.3 pg (ref 26.0–34.0)
MCHC: 33.5 g/dL (ref 30.0–36.0)
MCV: 93.4 fL (ref 80.0–100.0)
Platelets: 191 10*3/uL (ref 150–400)
RBC: 4.22 MIL/uL (ref 4.22–5.81)
RDW: 12.8 % (ref 11.5–15.5)
WBC: 7 10*3/uL (ref 4.0–10.5)
nRBC: 0 % (ref 0.0–0.2)

## 2023-07-05 LAB — I-STAT CHEM 8, ED
BUN: 14 mg/dL (ref 6–20)
Calcium, Ion: 1.16 mmol/L (ref 1.15–1.40)
Chloride: 110 mmol/L (ref 98–111)
Creatinine, Ser: 0.9 mg/dL (ref 0.61–1.24)
Glucose, Bld: 64 mg/dL — ABNORMAL LOW (ref 70–99)
HCT: 40 % (ref 39.0–52.0)
Hemoglobin: 13.6 g/dL (ref 13.0–17.0)
Potassium: 3.3 mmol/L — ABNORMAL LOW (ref 3.5–5.1)
Sodium: 143 mmol/L (ref 135–145)
TCO2: 22 mmol/L (ref 22–32)

## 2023-07-05 LAB — URINALYSIS, ROUTINE W REFLEX MICROSCOPIC
Bilirubin Urine: NEGATIVE
Glucose, UA: NEGATIVE mg/dL
Hgb urine dipstick: NEGATIVE
Ketones, ur: NEGATIVE mg/dL
Leukocytes,Ua: NEGATIVE
Nitrite: NEGATIVE
Protein, ur: NEGATIVE mg/dL
Specific Gravity, Urine: 1.013 (ref 1.005–1.030)
pH: 5 (ref 5.0–8.0)

## 2023-07-05 LAB — COMPREHENSIVE METABOLIC PANEL WITH GFR
ALT: 20 U/L (ref 0–44)
AST: 25 U/L (ref 15–41)
Albumin: 3.2 g/dL — ABNORMAL LOW (ref 3.5–5.0)
Alkaline Phosphatase: 45 U/L (ref 38–126)
Anion gap: 8 (ref 5–15)
BUN: 14 mg/dL (ref 6–20)
CO2: 21 mmol/L — ABNORMAL LOW (ref 22–32)
Calcium: 8.4 mg/dL — ABNORMAL LOW (ref 8.9–10.3)
Chloride: 112 mmol/L — ABNORMAL HIGH (ref 98–111)
Creatinine, Ser: 0.88 mg/dL (ref 0.61–1.24)
GFR, Estimated: >60 mL/min (ref >60)
Glucose, Bld: 65 mg/dL — ABNORMAL LOW (ref 70–99)
Potassium: 3.3 mmol/L — ABNORMAL LOW (ref 3.5–5.1)
Sodium: 141 mmol/L (ref 135–145)
Total Bilirubin: 0.4 mg/dL (ref 0.0–1.2)
Total Protein: 6.1 g/dL — ABNORMAL LOW (ref 6.5–8.1)

## 2023-07-05 LAB — I-STAT CG4 LACTIC ACID, ED: Lactic Acid, Venous: 0.6 mmol/L (ref 0.5–1.9)

## 2023-07-05 LAB — SAMPLE TO BLOOD BANK

## 2023-07-05 LAB — PROTIME-INR
INR: 1 (ref 0.8–1.2)
Prothrombin Time: 13.1 s (ref 11.4–15.2)

## 2023-07-05 LAB — ETHANOL: Alcohol, Ethyl (B): <10 mg/dL (ref <10)

## 2023-07-05 MED ORDER — IBUPROFEN 800 MG PO TABS
800.0000 mg | ORAL_TABLET | Freq: Once | ORAL | Status: AC
  Administered 2023-07-05: 800 mg via ORAL
  Filled 2023-07-05: qty 1

## 2023-07-05 MED ORDER — METHOCARBAMOL 500 MG PO TABS: 500.0000 mg | ORAL_TABLET | Freq: Three times a day (TID) | ORAL | 0 refills | Status: DC | PRN

## 2023-07-05 MED ORDER — IBUPROFEN 800 MG PO TABS: 800.0000 mg | ORAL_TABLET | Freq: Four times a day (QID) | ORAL | 0 refills | Status: DC | PRN
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
## 2023-07-05 NOTE — ED Notes (Signed)
 Dressing applied to right leg, gauze and kirlex
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
## 2023-07-06 ENCOUNTER — Other Ambulatory Visit: Payer: Self-pay

## 2023-07-06 ENCOUNTER — Emergency Department
Admission: EM | Admit: 2023-07-06 | Discharge: 2023-07-06 | Disposition: A | Discharge status: Home / Self Care | Attending: Emergency Medicine | Admitting: Emergency Medicine

## 2023-07-06 ENCOUNTER — Emergency Department

## 2023-07-06 DIAGNOSIS — Y92410 Unspecified street and highway as the place of occurrence of the external cause: Secondary | ICD-10-CM | POA: Insufficient documentation

## 2023-07-06 DIAGNOSIS — S80811A Abrasion, right lower leg, initial encounter: Secondary | ICD-10-CM

## 2023-07-06 DIAGNOSIS — S8011XA Contusion of right lower leg, initial encounter: Secondary | ICD-10-CM | POA: Insufficient documentation

## 2023-07-06 DIAGNOSIS — M25571 Pain in right ankle and joints of right foot: Secondary | ICD-10-CM | POA: Diagnosis present

## 2023-07-06 DIAGNOSIS — T07XXXA Unspecified multiple injuries, initial encounter: Secondary | ICD-10-CM

## 2023-07-06 MED ORDER — DOUBLE ANTIBIOTIC 500-10000 UNIT/GM EX OINT
TOPICAL_OINTMENT | Freq: Two times a day (BID) | CUTANEOUS | Status: DC
  Administered 2023-07-06: 1 via TOPICAL
  Filled 2023-07-06: qty 28.4

## 2023-07-06 MED ORDER — HYDROCODONE-ACETAMINOPHEN 5-325 MG PO TABS
1.0000 | ORAL_TABLET | Freq: Once | ORAL | Status: AC
  Administered 2023-07-06: 1 via ORAL
  Filled 2023-07-06: qty 1

## 2023-07-06 MED ORDER — HYDROCODONE-ACETAMINOPHEN 5-325 MG PO TABS: 1.0000 | ORAL_TABLET | Freq: Four times a day (QID) | ORAL | 0 refills | Status: DC | PRN
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
## 2024-03-10 ENCOUNTER — Emergency Department

## 2024-03-10 ENCOUNTER — Other Ambulatory Visit: Payer: Self-pay

## 2024-03-10 ENCOUNTER — Emergency Department
Admission: EM | Admit: 2024-03-10 | Discharge: 2024-03-11 | Disposition: A | Attending: Emergency Medicine | Admitting: Emergency Medicine

## 2024-03-10 DIAGNOSIS — N200 Calculus of kidney: Secondary | ICD-10-CM

## 2024-03-10 DIAGNOSIS — N218 Other lower urinary tract calculus: Secondary | ICD-10-CM

## 2024-03-10 LAB — CBC
HCT: 40 % (ref 39.0–52.0)
Hemoglobin: 13.7 g/dL (ref 13.0–17.0)
MCH: 31.4 pg (ref 26.0–34.0)
MCHC: 34.3 g/dL (ref 30.0–36.0)
MCV: 91.7 fL (ref 80.0–100.0)
Platelets: 257 K/uL (ref 150–400)
RBC: 4.36 MIL/uL (ref 4.22–5.81)
RDW: 13.1 % (ref 11.5–15.5)
WBC: 8.2 K/uL (ref 4.0–10.5)
nRBC: 0 % (ref 0.0–0.2)

## 2024-03-10 LAB — COMPREHENSIVE METABOLIC PANEL WITH GFR
ALT: 14 U/L (ref 0–44)
AST: 18 U/L (ref 15–41)
Albumin: 4.1 g/dL (ref 3.5–5.0)
Alkaline Phosphatase: 68 U/L (ref 38–126)
Anion gap: 11 (ref 5–15)
BUN: 14 mg/dL (ref 6–20)
CO2: 25 mmol/L (ref 22–32)
Calcium: 9.4 mg/dL (ref 8.9–10.3)
Chloride: 104 mmol/L (ref 98–111)
Creatinine, Ser: 0.95 mg/dL (ref 0.61–1.24)
GFR, Estimated: >60 mL/min (ref >60)
Glucose, Bld: 92 mg/dL (ref 70–99)
Potassium: 4.2 mmol/L (ref 3.5–5.1)
Sodium: 140 mmol/L (ref 135–145)
Total Bilirubin: <0.2 mg/dL (ref 0.0–1.2)
Total Protein: 7 g/dL (ref 6.5–8.1)

## 2024-03-10 MED ORDER — TAMSULOSIN HCL 0.4 MG PO CAPS
0.4000 mg | ORAL_CAPSULE | Freq: Once | ORAL | Status: AC
  Administered 2024-03-11: 0.4 mg via ORAL
  Filled 2024-03-10: qty 1

## 2024-03-10 MED ORDER — ACETAMINOPHEN 325 MG PO TABS
650.0000 mg | ORAL_TABLET | Freq: Once | ORAL | Status: AC
  Administered 2024-03-10: 650 mg via ORAL
  Filled 2024-03-10: qty 2

## 2024-03-10 MED ORDER — METHOCARBAMOL 500 MG PO TABS
500.0000 mg | ORAL_TABLET | Freq: Once | ORAL | Status: AC
  Administered 2024-03-10: 500 mg via ORAL
  Filled 2024-03-10: qty 1

## 2024-03-10 MED ORDER — ONDANSETRON HCL 4 MG/2ML IJ SOLN
4.0000 mg | Freq: Once | INTRAMUSCULAR | Status: AC
  Administered 2024-03-10: 4 mg via INTRAVENOUS
  Filled 2024-03-10: qty 2

## 2024-03-10 MED ORDER — MORPHINE SULFATE (PF) 2 MG/ML IV SOLN
2.0000 mg | Freq: Once | INTRAVENOUS | Status: AC
  Administered 2024-03-10: 2 mg via INTRAVENOUS
  Filled 2024-03-10: qty 1

## 2024-03-10 MED ORDER — SODIUM CHLORIDE 0.9 % IV BOLUS
1000.0000 mL | Freq: Once | INTRAVENOUS | Status: AC
  Administered 2024-03-10: 1000 mL via INTRAVENOUS

## 2024-03-10 MED ORDER — KETOROLAC TROMETHAMINE 30 MG/ML IJ SOLN
30.0000 mg | Freq: Once | INTRAMUSCULAR | Status: AC
  Administered 2024-03-11: 30 mg via INTRAVENOUS
  Filled 2024-03-10: qty 1

## 2024-03-10 NOTE — ED Notes (Signed)
 See triage note. Pt had last injections approx 1 month ago. Pt states that this time they have not provided any relief.

## 2024-03-10 NOTE — ED Triage Notes (Signed)
 Pt comes with c/o back pain. Pt states this started week ago. Pt states he goes to [facility] for injections but they aren't help[ing. Pt states worse this morning and can barely walk.

## 2024-03-10 NOTE — ED Provider Notes (Incomplete)
 [facility] Provider Note    Event Date/Time   First MD Initiated Contact with Patient 03/10/24 2153     (approximate)   History   Back Pain   HPI  Riley Hines is a 38 y.o. male  with a past medical history of bipolar 2 disorder, GAD, tobacco use disorder, insomnia, ADHD*** presents to the emergency department with ***    Red Flag Symptoms: Cancer Fever IVDU FND Point tenderness (osteoporosis/extremes of age) Saddle anesthesia Trauma Urinary retention Bowel incontinence Wt loss   Physical Exam   Triage Vital Signs: ED Triage Vitals [03/10/24 1843]  Encounter Vitals Group     BP (!) 125/90     Girls Systolic BP Percentile      Girls Diastolic BP Percentile      Boys Systolic BP Percentile      Boys Diastolic BP Percentile      Pulse Rate 85     Resp 18     Temp 98 F (36.7 C)     Temp src      SpO2 99 %     Weight      Height      Head Circumference      Peak Flow      Pain Score 10     Pain Loc      Pain Education      Exclude from Growth Chart     Most recent vital signs: Vitals:   03/10/24 1843  BP: (!) 125/90  Pulse: 85  Resp: 18  Temp: 98 F (36.7 C)  SpO2: 99%    General: Well-appearing, in no acute distress. Appears stated age. Head: Normocephalic, atraumatic. CV: Regular rate, ***. Dorsalis pedis pulses 2+ bilaterally. <2 second capillary refill. Respiratory: Breath sounds clear b/l. No wheezes, rales, or rhonchi. No respiratory distress. Normal respiratory effort. Skin:Warm, dry, intact. No rashes, lesions, or ecchymosis. No cyanosis or pallor. Neurological: A&Ox4 to person, place, time, and situation. Sensation intact to L4, L5, and S1.  GI: Soft, non-distended, non-tender. No rebound or guarding.  MSK: No spinal tenderness or paraspinal tenderness. Normal ROM with knee extension, dorsiflexion, and plantarflexion and 5/5 strength in bilateral lower extremities. No swelling or obvious deformities.  Straight  leg raise*** No CVA tenderness bilaterally. Lumbar flexion, rotation ***, Ambulation ***  L4: anterior thigh sensation, knee extension, patellar reflex L5: 1st web space of foot sensation, dorsiflexion, medial hamstring reflex S1: lateral foot sensation, plantarflex, achilles reflex   ED Results / Procedures / Treatments   Labs (all labs ordered are listed, but only abnormal results are displayed) Labs Reviewed - No data to display   EKG     RADIOLOGY ***   PROCEDURES:  Critical Care performed: {CriticalCareYesNo:19197::Yes, see critical care procedure note(s),No} Med Data Critical Care [email or web address] ***  Procedures   MEDICATIONS ORDERED IN ED: Medications - No data to display   IMPRESSION / MDM / ASSESSMENT AND PLAN / ED COURSE  I reviewed the triage vital signs and the nursing notes.                              Differential diagnosis includes, but is not limited to, ***  Patient's presentation is most consistent with {EM COPA:27473}  *** I independently viewed the x-ray*** and radiologist's report.  I agree with the radiologist's report ***.  {If the patient is on the monitor, remove the brackets and asterisks on the  sentence below and remember to document it as a Procedure as well. Otherwise delete the sentence below:1} {**The patient is on the cardiac monitor to evaluate for evidence of arrhythmia and/or significant heart rate changes.**} {Remember to include, when applicable, any/all of the following data: independent review of imaging independent review of labs (comment specifically on pertinent positives and negatives) review of specific prior hospitalizations, PCP/specialist notes, etc. discuss meds given and prescribed document any discussion with consultants (including hospitalists) any clinical decision tools you used and why (PECARN, NEXUS, etc.) did you consider admitting the patient? document social determinants of health affecting patient's  care (homelessness, inability to follow up in a timely fashion, etc) document any pre-existing conditions increasing risk on current visit (e.g. diabetes and HTN increasing danger of high-risk chest pain/ACS) describes what meds you gave (especially parenteral) and why any other interventions?:1}     FINAL CLINICAL IMPRESSION(S) / ED DIAGNOSES   Final diagnoses:  None     Rx / DC Orders   ED Discharge Orders     None        Note:  This document was prepared using Dragon voice recognition software and may include unintentional dictation errors.

## 2024-03-11 LAB — URINALYSIS, ROUTINE W REFLEX MICROSCOPIC
Bilirubin Urine: NEGATIVE
Glucose, UA: NEGATIVE mg/dL
Ketones, ur: NEGATIVE mg/dL
Leukocytes,Ua: NEGATIVE
Nitrite: NEGATIVE
Protein, ur: 30 mg/dL — AB
RBC / HPF: >50 RBC/hpf (ref 0–5)
Specific Gravity, Urine: 1.013 (ref 1.005–1.030)
Squamous Epithelial / HPF: 0 /HPF (ref 0–5)
pH: 6 (ref 5.0–8.0)

## 2024-03-11 MED ORDER — TAMSULOSIN HCL 0.4 MG PO CAPS: 0.4000 mg | ORAL_CAPSULE | Freq: Every day | ORAL | 0 refills | Status: AC

## 2024-03-11 MED ORDER — HYDROCODONE-ACETAMINOPHEN 5-325 MG PO TABS: 1.0000 | ORAL_TABLET | Freq: Four times a day (QID) | ORAL | 0 refills | Status: AC | PRN

## 2024-03-11 MED ORDER — ONDANSETRON 4 MG PO TBDP: 4.0000 mg | ORAL_TABLET | Freq: Three times a day (TID) | ORAL | 0 refills | Status: DC | PRN

## 2024-03-11 MED ORDER — NAPROXEN 500 MG PO TBEC: 500.0000 mg | DELAYED_RELEASE_TABLET | Freq: Two times a day (BID) | ORAL | 0 refills | Status: AC

## 2024-03-11 NOTE — Discharge Instructions (Addendum)
 You have been seen in the Emergency Department (ED) today for pain caused by kidney stones.  As we have discussed, please drink plenty of fluids.  Please make a follow up appointment with the physician(s) listed elsewhere in this documentation -- Dr. Twylla.  You may take pain medication as needed but ONLY as prescribed.  Please also take your prescribed Flomax  daily.   Please see your doctor as soon as possible as stones may take 1-3 weeks to pass and you may require additional care or medications.  Do not drink alcohol, drive or participate in any other potentially dangerous activities while taking opiate pain medication as it may make you sleepy. Do not take this medication with any other sedating medications, either prescription or over-the-counter. If you were prescribed Percocet or Vicodin, do not take these with acetaminophen  (Tylenol ) as it is already contained within these medications.   Take Vicodin as needed for severe pain.  This medication is an opiate (or narcotic) pain medication and can be habit forming.  Use it as little as possible to achieve adequate pain control.  Do not use or use it with extreme caution if you have a history of opiate abuse or dependence.  If you are on a pain contract with your primary care doctor or a pain specialist, be sure to let them know you were prescribed this medication today from the [facility].  This medication is intended for your use only - do not give any to anyone else and keep it in a secure place where nobody else, especially children, have access to it.  It will also cause or worsen constipation, so you may want to consider taking an over-the-counter stool softener while you are taking this medication.  Return to the Emergency Department (ED) or call your doctor if you have any worsening pain, fever, painful urination, are unable to urinate, or develop other symptoms that concern you.

## 2024-04-11 ENCOUNTER — Encounter: Payer: Self-pay | Admitting: Emergency Medicine

## 2024-04-11 ENCOUNTER — Ambulatory Visit: Admission: EM | Admit: 2024-04-11 | Discharge: 2024-04-11 | Disposition: A | Discharge status: Home / Self Care

## 2024-04-11 DIAGNOSIS — M5442 Lumbago with sciatica, left side: Secondary | ICD-10-CM

## 2024-04-11 DIAGNOSIS — G8929 Other chronic pain: Secondary | ICD-10-CM

## 2024-04-11 DIAGNOSIS — M5441 Lumbago with sciatica, right side: Secondary | ICD-10-CM

## 2024-04-11 NOTE — ED Triage Notes (Signed)
 Pt c/o lower back pain. He has an extensive history of back pain and seeing pain management. He is taking gabapentin, Excedrin back and body, and BC powders without relief.

## 2024-04-11 NOTE — ED Notes (Signed)
 Patient is being discharged from the Urgent Care and sent to the Emergency Department via POV . Per venetia Motto, NP, patient is in need of higher level of care due to lower back pain with bilateral sciaticia. Patient is aware and verbalizes understanding of plan of care.  Vitals:   04/11/24 1730  BP: 128/82  Pulse: 73  Resp: 16  Temp: 98.1 F (36.7 C)  SpO2: 95%

## 2024-04-11 NOTE — ED Provider Notes (Signed)
 " [facility]    CSN: [id number] Arrival date & time: 04/11/24  1712      History   Chief Complaint Chief Complaint  Patient presents with   Back Pain    chronic    HPI Riley Hines is a 39 y.o. male.   HPI  39 year old male with past medical history significant for ADHD, anxiety, lung blebs, bipolar 2 disorder, and chronic lower back pain presents for evaluation of worsening low back pain and lower extremity weakness.  He denies saddle anesthesia or loss of bowel or bladder control.  Past Medical History:  Diagnosis Date   ADHD (attention deficit hyperactivity disorder)    Anxiety    Lung blebs [facility])     Patient Active Problem List   Diagnosis Date Noted   No-show for appointment 01/03/2019   Insomnia due to mental condition 11/19/2018   GAD (generalized anxiety disorder) 10/08/2018   Tobacco use disorder 10/08/2018   Bipolar 2 disorder (HCC) 08/20/2018   Anxiety, generalized 06/12/2018    Past Surgical History:  Procedure Laterality Date   KNEE SURGERY Left        Home Medications    Prior to Admission medications  Medication Sig Start Date End Date Taking? Authorizing Provider  albuterol (VENTOLIN HFA) 108 (90 Base) MCG/ACT inhaler Inhale into the lungs. 05/02/22 04/11/24 Yes [provider]  atorvastatin (LIPITOR) 40 MG tablet Take 40 mg by mouth daily. 01/11/24 01/10/25 Yes [provider]  fluticasone-salmeterol (ADVAIR) 500-50 MCG/ACT AEPB Inhale into the lungs. 05/02/22 04/11/24 Yes [provider]  gabapentin (NEURONTIN) 100 MG capsule Take by mouth. 07/18/22  Yes [provider]  cyclobenzaprine  (FLEXERIL ) 5 MG tablet Take 1 tablet (5 mg total) by mouth 3 (three) times daily as needed. 02/25/23   Margrette, Myah A, PA-C  DULoxetine (CYMBALTA) 30 MG capsule Take 90 mg by mouth daily.    [provider]  escitalopram  (LEXAPRO ) 20 MG tablet Take 20 mg by mouth daily.    [provider]  ibuprofen   (ADVIL ) 800 MG tablet Take 1 tablet (800 mg total) by mouth every 6 (six) hours as needed for moderate pain (pain score 4-6). 07/05/23   Haze Lonni PARAS, MD  methocarbamol  (ROBAXIN ) 500 MG tablet Take 1 tablet (500 mg total) by mouth every 8 (eight) hours as needed for muscle spasms. 07/05/23   Haze Lonni PARAS, MD  ondansetron  (ZOFRAN -ODT) 4 MG disintegrating tablet Take 1 tablet (4 mg total) by mouth every 8 (eight) hours as needed for nausea or vomiting. 03/11/24   Dunlap, Summer, PA-C  QUEtiapine  (SEROQUEL ) 25 MG tablet Take 25 mg by mouth at bedtime.    [provider]    Family History Family History  Problem Relation Age of Onset   Anxiety disorder Father    Depression Father    ADD / ADHD Brother     Social History Social History[1]   Allergies   Oxycodone and Oxycodone-acetaminophen    Review of Systems Review of Systems  Musculoskeletal:  Positive for back pain.  Neurological:  Positive for weakness and numbness.     Physical Exam Triage Vital Signs ED Triage Vitals  Encounter Vitals Group     BP      Girls Systolic BP Percentile      Girls Diastolic BP Percentile      Boys Systolic BP Percentile      Boys Diastolic BP Percentile      Pulse      Resp  Temp      Temp src      SpO2      Weight      Height      Head Circumference      Peak Flow      Pain Score      Pain Loc      Pain Education      Exclude from Growth Chart    No data found.  Updated Vital Signs BP 128/82 (BP Location: Right Arm)   Pulse 73   Temp 98.1 F (36.7 C) (Oral)   Resp 16   Ht 5' 5 (1.651 m)   Wt 162 lb 0.6 oz (73.5 kg)   SpO2 95%   BMI 26.96 kg/m   Visual Acuity Right Eye Distance:   Left Eye Distance:   Bilateral Distance:    Right Eye Near:   Left Eye Near:    Bilateral Near:     Physical Exam Vitals and nursing note reviewed.  Constitutional:      Appearance: Normal appearance. He is not ill-appearing.  HENT:     Head:  Normocephalic and atraumatic.  Skin:    General: Skin is warm and dry.     Capillary Refill: Capillary refill takes less than 2 seconds.     Findings: No rash.  Neurological:     General: No focal deficit present.     Mental Status: He is alert and oriented to person, place, and time.      UC Treatments / Results  Labs (all labs ordered are listed, but only abnormal results are displayed) Labs Reviewed - No data to display  EKG   Radiology No results found.  Procedures Procedures (including critical care time)  Medications Ordered in UC Medications - No data to display  Initial Impression / Assessment and Plan / UC Course  I have reviewed the triage vital signs and the nursing notes.  Pertinent labs & imaging results that were available during my care of the patient were reviewed by me and considered in my medical decision making (see chart for details).   Patient is a nontoxic-appearing 39 year old male presenting for evaluation of ongoing low back pain.  He is followed by pain management at [facility] and reports that he was determined he had a slipped disc at L5-S1.  He has been seeing pain management for the past year and over the past 2 weeks he developed an increase pain that is constant with alternating leg weakness.  No saddle anesthesia or fecal or bladder incontinence.  He reports he is unable to stand up straight due to the pain he is experiencing in his back.  He reports that he came here tonight because he would like some answers as to what is going on with his back.  I have advised the patient that per the note from pain management dated 04/01/2024 that if his symptoms continued he would need to go to the emergency department.  They are planning for a repeat physical exam and repeat MRI but should his symptoms get worse he was to go to the ER where emergent MRI could be performed.  I have advised the patient that we do not have the ability to do MRI here in urgent care.  He  is moving all extremities independently, though he is walking slightly hunched over.  He will go to [facility] to be evaluated in the ER and have repeat imaging.   Final Clinical Impressions(s) / UC Diagnoses  Final diagnoses:  Chronic bilateral low back pain with bilateral sciatica     Discharge Instructions      Please go to the emergency department to have repeat MRI imaging of your lumbar spine for your increasing pain and lower extremity weakness.     ED Prescriptions   None    PDMP not reviewed this encounter.    [1]  Social History Tobacco Use   Smoking status: Every Day    Current packs/day: 1.00    Types: Cigarettes   Smokeless tobacco: Never   Tobacco comments:    1 pack every two days   Vaping Use   Vaping status: Former   Quit date: 02/07/2018  Substance Use Topics   Alcohol use: Not Currently   Drug use: Never     Bernardino Ditch, NP 04/11/24 1738  "

## 2024-04-11 NOTE — Discharge Instructions (Addendum)
 Please go to the emergency department to have repeat MRI imaging of your lumbar spine for your increasing pain and lower extremity weakness.
